# Patient Record
Sex: Female | Born: 1970 | Race: White | Hispanic: No | Marital: Married | Smoking: Former smoker
Health system: Southern US, Community
[De-identification: ages and names within clinical notes are randomized; demographics above are authoritative.]

## PROBLEM LIST (undated history)

## (undated) DIAGNOSIS — Z789 Other specified health status: Secondary | ICD-10-CM

## (undated) DIAGNOSIS — F32A Depression, unspecified: Secondary | ICD-10-CM

## (undated) DIAGNOSIS — T7840XA Allergy, unspecified, initial encounter: Secondary | ICD-10-CM

## (undated) DIAGNOSIS — M5136 Other intervertebral disc degeneration, lumbar region: Secondary | ICD-10-CM

## (undated) DIAGNOSIS — F419 Anxiety disorder, unspecified: Secondary | ICD-10-CM

## (undated) DIAGNOSIS — M51369 Other intervertebral disc degeneration, lumbar region without mention of lumbar back pain or lower extremity pain: Secondary | ICD-10-CM

## (undated) DIAGNOSIS — K219 Gastro-esophageal reflux disease without esophagitis: Secondary | ICD-10-CM

## (undated) DIAGNOSIS — M1990 Unspecified osteoarthritis, unspecified site: Secondary | ICD-10-CM

## (undated) DIAGNOSIS — F329 Major depressive disorder, single episode, unspecified: Secondary | ICD-10-CM

## (undated) HISTORY — DX: Other intervertebral disc degeneration, lumbar region without mention of lumbar back pain or lower extremity pain: M51.369

## (undated) HISTORY — DX: Depression, unspecified: F32.A

## (undated) HISTORY — DX: Unspecified osteoarthritis, unspecified site: M19.90

## (undated) HISTORY — DX: Major depressive disorder, single episode, unspecified: F32.9

## (undated) HISTORY — DX: Other intervertebral disc degeneration, lumbar region: M51.36

## (undated) HISTORY — DX: Anxiety disorder, unspecified: F41.9

## (undated) HISTORY — PX: TUBAL LIGATION: SHX77

## (undated) HISTORY — DX: Other specified health status: Z78.9

## (undated) HISTORY — DX: Gastro-esophageal reflux disease without esophagitis: K21.9

## (undated) HISTORY — DX: Allergy, unspecified, initial encounter: T78.40XA

---

## 2006-03-11 ENCOUNTER — Emergency Department: Payer: Self-pay | Admitting: Internal Medicine

## 2007-07-28 ENCOUNTER — Emergency Department: Payer: Self-pay | Admitting: Emergency Medicine

## 2009-03-26 HISTORY — PX: ENDOMETRIAL ABLATION: SHX621

## 2009-11-23 ENCOUNTER — Encounter: Payer: Self-pay | Admitting: Family Medicine

## 2010-10-06 ENCOUNTER — Ambulatory Visit: Payer: Self-pay | Admitting: Family Medicine

## 2011-07-04 ENCOUNTER — Encounter: Payer: Self-pay | Admitting: Family Medicine

## 2011-07-05 ENCOUNTER — Encounter: Payer: Self-pay | Admitting: Family Medicine

## 2011-11-05 ENCOUNTER — Encounter: Payer: Self-pay | Admitting: Family Medicine

## 2012-02-04 ENCOUNTER — Encounter: Payer: Self-pay | Admitting: Family Medicine

## 2012-07-30 ENCOUNTER — Encounter: Payer: Self-pay | Admitting: Family Medicine

## 2013-03-26 LAB — HM MAMMOGRAPHY: HM MAMMO: NORMAL

## 2013-03-26 LAB — HM PAP SMEAR: HM Pap smear: NORMAL

## 2013-04-30 ENCOUNTER — Emergency Department: Payer: Self-pay | Admitting: Emergency Medicine

## 2013-04-30 LAB — COMPREHENSIVE METABOLIC PANEL
ALK PHOS: 87 U/L
Albumin: 3.7 g/dL (ref 3.4–5.0)
Anion Gap: 2 — ABNORMAL LOW (ref 7–16)
BUN: 11 mg/dL (ref 7–18)
Bilirubin,Total: 0.2 mg/dL (ref 0.2–1.0)
Calcium, Total: 8.8 mg/dL (ref 8.5–10.1)
Chloride: 107 mmol/L (ref 98–107)
Co2: 31 mmol/L (ref 21–32)
Creatinine: 0.82 mg/dL (ref 0.60–1.30)
GLUCOSE: 90 mg/dL (ref 65–99)
Osmolality: 278 (ref 275–301)
Potassium: 3.7 mmol/L (ref 3.5–5.1)
SGOT(AST): 12 U/L — ABNORMAL LOW (ref 15–37)
SGPT (ALT): 19 U/L (ref 12–78)
SODIUM: 140 mmol/L (ref 136–145)
TOTAL PROTEIN: 7.2 g/dL (ref 6.4–8.2)

## 2013-04-30 LAB — CBC
HCT: 38.1 % (ref 35.0–47.0)
HGB: 12.8 g/dL (ref 12.0–16.0)
MCH: 31.4 pg (ref 26.0–34.0)
MCHC: 33.6 g/dL (ref 32.0–36.0)
MCV: 93 fL (ref 80–100)
Platelet: 236 10*3/uL (ref 150–440)
RBC: 4.08 10*6/uL (ref 3.80–5.20)
RDW: 12.9 % (ref 11.5–14.5)
WBC: 7.2 10*3/uL (ref 3.6–11.0)

## 2013-04-30 LAB — TROPONIN I: Troponin-I: <0.02 ng/mL

## 2013-06-26 DIAGNOSIS — M5136 Other intervertebral disc degeneration, lumbar region: Secondary | ICD-10-CM | POA: Insufficient documentation

## 2013-06-26 DIAGNOSIS — K589 Irritable bowel syndrome without diarrhea: Secondary | ICD-10-CM | POA: Insufficient documentation

## 2013-07-08 ENCOUNTER — Encounter: Payer: Self-pay | Admitting: Rheumatology

## 2013-07-24 ENCOUNTER — Encounter: Payer: Self-pay | Admitting: Rheumatology

## 2013-11-23 ENCOUNTER — Encounter: Payer: Self-pay | Admitting: Family Medicine

## 2013-11-23 LAB — HM PAP SMEAR: HM PAP: NORMAL

## 2013-11-25 ENCOUNTER — Encounter: Payer: Self-pay | Admitting: Family Medicine

## 2013-11-26 LAB — LIPID PANEL
Cholesterol: 170 (ref 0–200)
HDL: 59 (ref 35–70)
LDL CALC: 91
TRIGLYCERIDES: 98 (ref 40–160)

## 2013-11-26 LAB — VITAMIN D 25 HYDROXY (VIT D DEFICIENCY, FRACTURES): Vit D, 25-Hydroxy: 48.2

## 2013-11-26 LAB — CBC AND DIFFERENTIAL
HCT: 40 (ref 36–46)
HEMOGLOBIN: 14 (ref 12.0–16.0)
NEUTROS ABS: 4
PLATELETS: 237 (ref 150–399)
WBC: 6.4

## 2013-11-26 LAB — BASIC METABOLIC PANEL
BUN: 13 (ref 4–21)
Creatinine: 0.8 (ref 0.5–1.1)
Glucose: 97
POTASSIUM: 4.2 (ref 3.4–5.3)
SODIUM: 139 (ref 137–147)

## 2013-11-26 LAB — HEPATIC FUNCTION PANEL
ALT: 8 (ref 7–35)
AST: 15 (ref 13–35)

## 2013-11-26 LAB — TSH: TSH: 1.33 (ref 0.41–5.90)

## 2014-10-05 ENCOUNTER — Ambulatory Visit: Payer: Self-pay | Admitting: Family Medicine

## 2015-02-14 ENCOUNTER — Ambulatory Visit (INDEPENDENT_AMBULATORY_CARE_PROVIDER_SITE_OTHER): Payer: 59 | Admitting: Family Medicine

## 2015-02-14 ENCOUNTER — Encounter: Payer: Self-pay | Admitting: Family Medicine

## 2015-02-14 VITALS — BP 105/69 | HR 86 | Resp 16 | Ht 65.0 in | Wt 161.2 lb

## 2015-02-14 DIAGNOSIS — Z8249 Family history of ischemic heart disease and other diseases of the circulatory system: Secondary | ICD-10-CM | POA: Diagnosis not present

## 2015-02-14 DIAGNOSIS — F418 Other specified anxiety disorders: Secondary | ICD-10-CM

## 2015-02-14 DIAGNOSIS — L309 Dermatitis, unspecified: Secondary | ICD-10-CM | POA: Insufficient documentation

## 2015-02-14 DIAGNOSIS — F32A Depression, unspecified: Secondary | ICD-10-CM | POA: Insufficient documentation

## 2015-02-14 DIAGNOSIS — R5382 Chronic fatigue, unspecified: Secondary | ICD-10-CM | POA: Diagnosis not present

## 2015-02-14 DIAGNOSIS — K219 Gastro-esophageal reflux disease without esophagitis: Secondary | ICD-10-CM | POA: Insufficient documentation

## 2015-02-14 DIAGNOSIS — M1990 Unspecified osteoarthritis, unspecified site: Secondary | ICD-10-CM

## 2015-02-14 DIAGNOSIS — M545 Low back pain, unspecified: Secondary | ICD-10-CM | POA: Insufficient documentation

## 2015-02-14 DIAGNOSIS — F329 Major depressive disorder, single episode, unspecified: Secondary | ICD-10-CM | POA: Insufficient documentation

## 2015-02-14 DIAGNOSIS — K589 Irritable bowel syndrome without diarrhea: Secondary | ICD-10-CM | POA: Insufficient documentation

## 2015-02-14 DIAGNOSIS — F419 Anxiety disorder, unspecified: Secondary | ICD-10-CM | POA: Insufficient documentation

## 2015-02-14 DIAGNOSIS — F17200 Nicotine dependence, unspecified, uncomplicated: Secondary | ICD-10-CM

## 2015-02-14 DIAGNOSIS — G8929 Other chronic pain: Secondary | ICD-10-CM | POA: Insufficient documentation

## 2015-02-14 DIAGNOSIS — Z720 Tobacco use: Secondary | ICD-10-CM | POA: Diagnosis not present

## 2015-02-14 MED ORDER — TRIAMCINOLONE ACETONIDE 0.1 % EX CREA: 1.0000 "application " | TOPICAL_CREAM | Freq: Two times a day (BID) | CUTANEOUS | Status: DC

## 2015-02-14 MED ORDER — LORAZEPAM 0.5 MG PO TABS: 0.5000 mg | ORAL_TABLET | ORAL | Status: DC | PRN

## 2015-02-14 NOTE — Assessment & Plan Note (Addendum)
Symptoms diet controlled. Brother diet of esophageal cancer. Recommend consult with GI specialist to determine if she is at increased risk. Pt would like to think about it at this time.  Check CMP.

## 2015-02-14 NOTE — Assessment & Plan Note (Signed)
5 minutes smoking cessation counseling completed. Pt encouraged to use resources such as [place] [facility]uitline. Handouts given. Pt is not ready to quit at this time.

## 2015-02-14 NOTE — Progress Notes (Signed)
Subjective:    Patient ID: Kayla Alexander, female    DOB: [birth year], 44 y.o.   MRN: [id number]  HPI: Kayla Alexander is a 44 y.o. female presenting on 02/14/2015 for Establish Care   HPI Pt presents to establish care today. Previous care provider was [facility].  It has been 1.5 years since her last PCP visit. Records from previous provider will be requested and reviewed. Current medical problems include:  Acid Reflux: Symptoms controlled at  Home with diet. Avoids food triggers. On medication in the past- did well. Occasional symptoms. No n/v, no regurg or dyphagia.  Depression/ Anxiety: Has mood ups and downs. Symptoms got worse when she stopped having her period. Was on medication in the past but unable to take due to side effects. Took lorazepam PRN for anxiety attacks. Symptoms occur occasionally. Uses medication 10-15 per month.  Eczema: Pt gets dry skin and eczematous rash in the winter. Bilateral hands and legs.  Uses kenalog cream as needed.    Pt also concerned about fatigue. She is tried all the time despite adequate rest and exercise.  Health maintenance:  Last pap smear: 2015- normal, n hx abnormal- unsure if HPV cotesting done. Mammogram: Normal  TDAP- unsure. Declines. Flu shot- declines.  Current smoker- quit for 3 years in the past. Currently smoking 1/2 pack per day.  Occasional ETOH.  Exercise: Walks/run on treadmill- 3-4 x per week.     Past Medical History  Diagnosis Date  . GERD (gastroesophageal reflux disease)   . Depression   . Allergy   . Arthritis   . Gravida 2 para 2    Social History   Social History  . Marital Status: Divorced    Spouse Name: N/A  . Number of Children: N/A  . Years of Education: N/A   Occupational History  . Not on file.   Social History Main Topics  . Smoking status: Current Every Day Smoker -- 0.50 packs/day for 20 years    Types: Cigarettes  . Smokeless tobacco: Never Used  . Alcohol Use: Not on file  . Drug  Use: Not on file  . Sexual Activity: Not on file   Other Topics Concern  . Not on file   Social History Narrative  . No narrative on file   Family History  Problem Relation Age of Onset  . Stroke Mother   . Cancer Sister   . Depression Sister   . Diabetes Maternal Grandmother   . Diabetes Maternal Grandfather   . Heart disease Paternal Grandmother    No current outpatient prescriptions on file prior to visit.   No current facility-administered medications on file prior to visit.    Review of Systems  Constitutional: Positive for fatigue. Negative for fever and chills.  HENT: Negative.   Respiratory: Negative for cough, chest tightness and wheezing.   Cardiovascular: Negative for chest pain and leg swelling.  Gastrointestinal: Negative for nausea, vomiting, abdominal pain, diarrhea and constipation.  Endocrine: Negative.  Negative for cold intolerance, heat intolerance, polydipsia, polyphagia and polyuria.  Genitourinary: Negative for dysuria and difficulty urinating.  Musculoskeletal: Negative.   Neurological: Negative for dizziness, light-headedness and numbness.  Psychiatric/Behavioral: Negative.    Per HPI unless specifically indicated above     Objective:    BP 105/69 mmHg  Pulse 86  Resp 16  Ht 5\' 5"  (1.651 m)  Wt 161 lb 3.2 oz (73.12 kg)  BMI 26.83 kg/m2  Wt Readings from Last 3 Encounters:  02/14/15  161 lb 3.2 oz (73.12 kg)    Physical Exam  Constitutional: She is oriented to person, place, and time. She appears well-developed and well-nourished.  HENT:  Head: Normocephalic and atraumatic.  Neck: Neck supple.  Cardiovascular: Normal rate, regular rhythm and normal heart sounds.  Exam reveals no gallop and no friction rub.   No murmur heard. Pulmonary/Chest: Effort normal and breath sounds normal. She has no wheezes. She exhibits no tenderness.  Abdominal: Soft. Normal appearance and bowel sounds are normal. She exhibits no distension and no mass. There  is no tenderness. There is no rebound and no guarding.  Musculoskeletal: Normal range of motion. She exhibits no edema or tenderness.  Lymphadenopathy:    She has no cervical adenopathy.  Neurological: She is alert and oriented to person, place, and time.  Skin: Skin is warm and dry. Rash noted. She is not diaphoretic. No pallor.  Pink scaling rash on bilateral hands and elbows consistent with eczema.    Results for orders placed or performed in visit on 02/14/15  HM MAMMOGRAPHY  Result Value Ref Range   HM Mammogram normal   HM PAP SMEAR  Result Value Ref Range   HM Pap smear normal       Assessment & Plan:   Problem List Items Addressed This Visit      Digestive   GERD (gastroesophageal reflux disease) - Primary    Symptoms diet controlled. Brother diet of esophageal cancer. Recommend consult with GI specialist to determine if she is at increased risk. Pt would like to think about it at this time.  Check CMP.       Relevant Orders   Basic Metabolic Panel (BMET)   IBS (irritable bowel syndrome)     Musculoskeletal and Integument   Arthritis   Eczema    Kenalog cream renewed.       Relevant Medications   triamcinolone cream (KENALOG) 0.1 %     Other   Anxiety and depression    Check TSH, vitamin D. Renew PRN lorazepam for panic attacks. Encouraged daily exercise to help calm symptoms.        Relevant Medications   LORazepam (ATIVAN) 0.5 MG tablet   Other Relevant Orders   TSH   VITAMIN D 25 Hydroxy (Vit-D Deficiency, Fractures)   Smoker    5 minutes smoking cessation counseling completed. Pt encouraged to use resources such as [place] [facility]uitline. Handouts given. Pt is not ready to quit at this time.        Other Visit Diagnoses    Chronic fatigue        Check labs to r/o organic cause: TSH, B12, Vitamin D.  Recommend OTC 2000IU vitamin D daily to help increase energy.    Relevant Orders    Vitamin B12    Family history of heart disease        Baseline lipids.          Meds ordered this encounter  Medications  . DISCONTD: LORazepam (ATIVAN) 0.5 MG tablet    Sig: Take 0.5 mg by mouth as needed for anxiety.  . triamcinolone cream (KENALOG) 0.1 %    Sig: Apply 1 application topically 2 (two) times daily.    Dispense:  30 g    Refill:  3    Order Specific Question:  Supervising Provider    Answer:  Arlis Porta [id number]  . LORazepam (ATIVAN) 0.5 MG tablet    Sig: Take 1 tablet (0.5 mg total) by  mouth as needed for anxiety.    Dispense:  30 tablet    Refill:  1    Order Specific Question:  Supervising Provider    Answer:  Arlis Porta [id number]      Follow up plan: Return in about 1 year (around 02/14/2016) for well woman.

## 2015-02-14 NOTE — Assessment & Plan Note (Signed)
Check TSH, vitamin D. Renew PRN lorazepam for panic attacks. Encouraged daily exercise to help calm symptoms.

## 2015-02-14 NOTE — Assessment & Plan Note (Signed)
Kenalog cream renewed.

## 2015-08-10 DIAGNOSIS — C4491 Basal cell carcinoma of skin, unspecified: Secondary | ICD-10-CM

## 2015-08-10 HISTORY — DX: Basal cell carcinoma of skin, unspecified: C44.91

## 2015-10-17 ENCOUNTER — Encounter: Payer: Self-pay | Admitting: Family Medicine

## 2015-10-17 ENCOUNTER — Ambulatory Visit (INDEPENDENT_AMBULATORY_CARE_PROVIDER_SITE_OTHER): Payer: 59 | Admitting: Family Medicine

## 2015-10-17 DIAGNOSIS — F419 Anxiety disorder, unspecified: Secondary | ICD-10-CM

## 2015-10-17 DIAGNOSIS — G4700 Insomnia, unspecified: Secondary | ICD-10-CM | POA: Diagnosis not present

## 2015-10-17 DIAGNOSIS — F329 Major depressive disorder, single episode, unspecified: Secondary | ICD-10-CM

## 2015-10-17 DIAGNOSIS — F418 Other specified anxiety disorders: Secondary | ICD-10-CM | POA: Diagnosis not present

## 2015-10-17 DIAGNOSIS — F32A Depression, unspecified: Secondary | ICD-10-CM

## 2015-10-17 LAB — BASIC METABOLIC PANEL WITH GFR
BUN: 12 mg/dL (ref 7–25)
CALCIUM: 8.6 mg/dL (ref 8.6–10.2)
CHLORIDE: 105 mmol/L (ref 98–110)
CO2: 25 mmol/L (ref 20–31)
CREATININE: 0.92 mg/dL (ref 0.50–1.10)
GFR, Est African American: 87 mL/min (ref >=60)
GFR, Est Non African American: 75 mL/min (ref >=60)
GLUCOSE: 91 mg/dL (ref 65–99)
Potassium: 4.4 mmol/L (ref 3.5–5.3)
SODIUM: 138 mmol/L (ref 135–146)

## 2015-10-17 LAB — TSH: TSH: 0.64 mIU/L

## 2015-10-17 MED ORDER — LORAZEPAM 0.5 MG PO TABS: 0.5000 mg | ORAL_TABLET | ORAL | 2 refills | Status: DC | PRN

## 2015-10-17 MED ORDER — TRAZODONE HCL 50 MG PO TABS: 25.0000 mg | ORAL_TABLET | Freq: Every evening | ORAL | 3 refills | Status: DC | PRN

## 2015-10-17 NOTE — Patient Instructions (Addendum)
Learning About Sleeping Well What does sleeping well mean? Sleeping well means getting enough sleep. How much sleep is enough varies among people. The number of hours you sleep is not as important as how you feel when you wake up. If you do not feel refreshed, you probably need more sleep. Another sign of not getting enough sleep is feeling tired during the day. The average total nightly sleep time is 7 to 8 hours. Healthy adults may need a little more or a little less than this. Why is getting enough sleep important? Getting enough quality sleep is a basic part of good health. When your sleep suffers, your mood and your thoughts can suffer too. You may find yourself feeling more grumpy or stressed. Not getting enough sleep also can lead to serious problems, including injury, accidents, anxiety, and depression. What might cause poor sleeping? Many things can cause sleep problems, including: 1. Stress. Stress can be caused by fear about a single event, such as giving a speech. Or you may have ongoing stress, such as worry about work or school. 2. Depression, anxiety, and other mental or emotional conditions. 3. Changes in your sleep habits or surroundings. This includes changes that happen where you sleep, such as noise, light, or sleeping in a different bed. It also includes changes in your sleep pattern, such as having jet lag or working a late shift. 4. Health problems, such as pain, breathing problems, and restless legs syndrome. 5. Lack of regular exercise. How can you help yourself? Here are some tips that may help you sleep more soundly and wake up feeling more refreshed. Your sleeping area  1. Use your bedroom only for sleeping and sex. A bit of light reading may help you fall asleep. But if it doesn't, do your reading elsewhere in the house. Don't watch TV in bed. 2. Be sure your bed is big enough to stretch out comfortably, especially if you have a sleep partner. 3. Keep your bedroom  quiet, dark, and cool. Use curtains, blinds, or a sleep mask to block out light. To block out noise, use earplugs, soothing music, or a "white noise" machine. Your evening and bedtime routine  1. Create a relaxing bedtime routine. You might want to take a warm shower or bath, listen to soothing music, or drink a cup of noncaffeinated tea. 2. Go to bed at the same time every night. And get up at the same time every morning, even if you feel tired. What to avoid  1. Limit caffeine (coffee, tea, caffeinated sodas) during the day, and don't have any for at least 4 to 6 hours before bedtime. 2. Don't drink alcohol before bedtime. Alcohol can cause you to wake up more often during the night. 3. Don't smoke or use tobacco, especially in the evening. Nicotine can keep you awake. 4. Don't take naps during the day, especially close to bedtime. 5. Don't lie in bed awake for too long. If you can't fall asleep, or if you wake up in the middle of the night and can't get back to sleep within 15 minutes or so, get out of bed and go to another room until you feel sleepy. 6. Don't take medicine right before bed that may keep you awake or make you feel hyper or energized. Your doctor can tell you if your medicine may do this and if you can take it earlier in the day. If you can't sleep   Imagine yourself in a peaceful, pleasant scene. Focus on  the details and feelings of being in a place that is relaxing.  Get up and do a quiet or boring activity until you feel sleepy.  Don't drink any liquids after 6 p.m. if you wake up often because you have to go to the bathroom.    Trazodone tablets What is this medicine? TRAZODONE (TRAZ oh done) is used to treat depression. This medicine may be used for other purposes; ask your health care provider or pharmacist if you have questions. What should I tell my health care provider before I take this medicine? They need to know if you have any of these conditions: -attempted  suicide or thinking about it -bipolar disorder -bleeding problems -glaucoma -heart disease, or previous heart attack -irregular heart beat -kidney or liver disease -low levels of sodium in the blood -an unusual or allergic reaction to trazodone, other medicines, foods, dyes or preservatives -pregnant or trying to get pregnant -breast-feeding How should I use this medicine? Take this medicine by mouth with a glass of water. Follow the directions on the prescription label. Take this medicine shortly after a meal or a light snack. Take your medicine at regular intervals. Do not take your medicine more often than directed. Do not stop taking this medicine suddenly except upon the advice of your doctor. Stopping this medicine too quickly may cause serious side effects or your condition may worsen. A special MedGuide will be given to you by the pharmacist with each prescription and refill. Be sure to read this information carefully each time. Talk to your pediatrician regarding the use of this medicine in children. Special care may be needed. Overdosage: If you think you have taken too much of this medicine contact a poison control center or emergency room at once. NOTE: This medicine is only for you. Do not share this medicine with others. What if I miss a dose? If you miss a dose, take it as soon as you can. If it is almost time for your next dose, take only that dose. Do not take double or extra doses. What may interact with this medicine? Do not take this medicine with any of the following medications: -certain medicines for fungal infections like fluconazole, itraconazole, ketoconazole, posaconazole, voriconazole -cisapride -dofetilide -dronedarone -linezolid -MAOIs like Carbex, Eldepryl, Marplan, Nardil, and Parnate -mesoridazine -methylene blue (injected into a vein) -pimozide -saquinavir -thioridazine -ziprasidone This medicine may also interact with the following  medications: -alcohol -antiviral medicines for HIV or AIDS -aspirin and aspirin-like medicines -barbiturates like phenobarbital -certain medicines for blood pressure, heart disease, irregular heart beat -certain medicines for depression, anxiety, or psychotic disturbances -certain medicines for migraine headache like almotriptan, eletriptan, frovatriptan, naratriptan, rizatriptan, sumatriptan, zolmitriptan -certain medicines for seizures like carbamazepine and phenytoin -certain medicines for sleep -certain medicines that treat or prevent blood clots like dalteparin, enoxaparin, warfarin -digoxin -fentanyl -lithium -NSAIDS, medicines for pain and inflammation, like ibuprofen or naproxen -other medicines that prolong the QT interval (cause an abnormal heart rhythm) -rasagiline -supplements like St. John's wort, kava kava, valerian -tramadol -tryptophan This list may not describe all possible interactions. Give your health care provider a list of all the medicines, herbs, non-prescription drugs, or dietary supplements you use. Also tell them if you smoke, drink alcohol, or use illegal drugs. Some items may interact with your medicine. What should I watch for while using this medicine? Tell your doctor if your symptoms do not get better or if they get worse. Visit your doctor or health care professional for regular checks  on your progress. Because it may take several weeks to see the full effects of this medicine, it is important to continue your treatment as prescribed by your doctor. Patients and their families should watch out for new or worsening thoughts of suicide or depression. Also watch out for sudden changes in feelings such as feeling anxious, agitated, panicky, irritable, hostile, aggressive, impulsive, severely restless, overly excited and hyperactive, or not being able to sleep. If this happens, especially at the beginning of treatment or after a change in dose, call your health  care professional. Dennis Bast may get drowsy or dizzy. Do not drive, use machinery, or do anything that needs mental alertness until you know how this medicine affects you. Do not stand or sit up quickly, especially if you are an older patient. This reduces the risk of dizzy or fainting spells. Alcohol may interfere with the effect of this medicine. Avoid alcoholic drinks. This medicine may cause dry eyes and blurred vision. If you wear contact lenses you may feel some discomfort. Lubricating drops may help. See your eye doctor if the problem does not go away or is severe. Your mouth may get dry. Chewing sugarless gum, sucking hard candy and drinking plenty of water may help. Contact your doctor if the problem does not go away or is severe. What side effects may I notice from receiving this medicine? Side effects that you should report to your doctor or health care professional as soon as possible: -allergic reactions like skin rash, itching or hives, swelling of the face, lips, or tongue -fast, irregular heartbeat -feeling faint or lightheaded, falls -painful erections or other sexual dysfunction -suicidal thoughts or other mood changes -trembling Side effects that usually do not require medical attention (report to your doctor or health care professional if they continue or are bothersome): -constipation -headache -muscle aches or pains -nausea, vomiting -unusually weak or tired This list may not describe all possible side effects. Call your doctor for medical advice about side effects. You may report side effects to FDA at 1-800-FDA-1088. Where should I keep my medicine? Keep out of the reach of children. Store at room temperature between 15 and 30 degrees C (59 to 86 degrees F). Protect from light. Keep container tightly closed. Throw away any unused medicine after the expiration date. NOTE: This sheet is a summary. It may not cover all possible information. If you have questions about this  medicine, talk to your doctor, pharmacist, or health care provider.    2016, [employer]vier/Gold Standard. (2012-10-13 15:46:28)

## 2015-10-17 NOTE — Assessment & Plan Note (Signed)
Reviewed sleep hygiene tips to help with sleep. Trial of PRN trazodone. Pt cautioned not to mix lorazepam.

## 2015-10-17 NOTE — Assessment & Plan Note (Signed)
Renewed PRN ativan for panic symptoms. Consider SSRI if symptoms continue despite trazodone.

## 2015-10-17 NOTE — Progress Notes (Signed)
Subjective:    Patient ID: Kayla Alexander, female    DOB: [birth year], 45 y.o.   MRN: [id number]  HPI: Kayla Alexander is a 45 y.o. female presenting on 10/17/2015 for Medication Refill (anxiety still same )   HPI  Pt presents to discuss lorazepam for anxiety. Has noticed she is more irritable after she takes medications. Takes it at night PRN. Has noticed that she is irritable. Only takes lorazepam at bedtime Always feeling tired. Doesn't sleep well. Tried OTC sleep aids to help her with mild relief. Reports she wakes up at 2am from dreams or hot flashes. Not able to go back to sleep.   Past Medical History:  Diagnosis Date  . Allergy   . Arthritis   . Depression   . GERD (gastroesophageal reflux disease)   . Gravida 2 para 2     Current Outpatient Prescriptions on File Prior to Visit  Medication Sig  . triamcinolone cream (KENALOG) 0.1 % Apply 1 application topically 2 (two) times daily.   No current facility-administered medications on file prior to visit.     Review of Systems  Constitutional: Positive for fatigue. Negative for chills and fever.  HENT: Negative.   Respiratory: Negative for cough, chest tightness and wheezing.   Cardiovascular: Negative for chest pain and leg swelling.  Gastrointestinal: Negative for abdominal pain, constipation, diarrhea, nausea and vomiting.  Endocrine: Negative.  Negative for cold intolerance, heat intolerance, polydipsia, polyphagia and polyuria.  Genitourinary: Negative for difficulty urinating and dysuria.  Musculoskeletal: Negative.   Neurological: Negative for dizziness, light-headedness and numbness.  Psychiatric/Behavioral: Positive for sleep disturbance. Negative for agitation, behavioral problems, dysphoric mood, hallucinations and suicidal ideas. The patient is nervous/anxious.    Per HPI unless specifically indicated above GAD 7 : Generalized Anxiety Score 10/17/2015  Nervous, Anxious, on Edge 1  Control/stop worrying 1  Worry  too much - different things 2  Trouble relaxing 3  Restless 1  Easily annoyed or irritable 2  Afraid - awful might happen 1  Total GAD 7 Score 11  Anxiety Difficulty Not difficult at all        Objective:    BP 107/68 (BP Location: Right Arm, Patient Position: Sitting, Cuff Size: Normal)   Pulse 68   Temp 98.4 F (36.9 C) (Oral)   Resp 16   Ht 5\' 6"  (1.676 m)   Wt 168 lb (76.2 kg)   BMI 27.12 kg/m   Wt Readings from Last 3 Encounters:  10/17/15 168 lb (76.2 kg)  02/14/15 161 lb 3.2 oz (73.1 kg)    Physical Exam  Constitutional: She is oriented to person, place, and time. She appears well-developed and well-nourished.  HENT:  Head: Normocephalic and atraumatic.  Neck: Neck supple.  Cardiovascular: Normal rate, regular rhythm and normal heart sounds.  Exam reveals no gallop and no friction rub.   No murmur heard. Pulmonary/Chest: Effort normal and breath sounds normal. She has no wheezes. She exhibits no tenderness.  Abdominal: Soft. Normal appearance and bowel sounds are normal. She exhibits no distension and no mass. There is no tenderness. There is no rebound and no guarding.  Musculoskeletal: Normal range of motion. She exhibits no edema or tenderness.  Lymphadenopathy:    She has no cervical adenopathy.  Neurological: She is alert and oriented to person, place, and time.  Skin: Skin is warm and dry.  Psychiatric: Her speech is normal and behavior is normal. Judgment and thought content normal. Her mood appears anxious. Cognition and memory  are normal.   Results for orders placed or performed in visit on 02/14/15  HM MAMMOGRAPHY  Result Value Ref Range   HM Mammogram normal   HM PAP SMEAR  Result Value Ref Range   HM Pap smear normal       Assessment & Plan:   Problem List Items Addressed This Visit      Other   Anxiety and depression    Renewed PRN ativan for panic symptoms. Consider SSRI if symptoms continue despite trazodone.       Relevant Medications    LORazepam (ATIVAN) 0.5 MG tablet   Other Relevant Orders   VITAMIN D 25 Hydroxy (Vit-D Deficiency, Fractures)   123456   BASIC METABOLIC PANEL WITH GFR   TSH   Insomnia    Reviewed sleep hygiene tips to help with sleep. Trial of PRN trazodone. Pt cautioned not to mix lorazepam.        Other Visit Diagnoses   None.     Meds ordered this encounter  Medications  . traZODone (DESYREL) 50 MG tablet    Sig: Take 0.5-1 tablets (25-50 mg total) by mouth at bedtime as needed for sleep.    Dispense:  30 tablet    Refill:  3    Order Specific Question:   Supervising Provider    Answer:   Arlis Porta [phone]  . LORazepam (ATIVAN) 0.5 MG tablet    Sig: Take 1 tablet (0.5 mg total) by mouth as needed for anxiety.    Dispense:  30 tablet    Refill:  2    Order Specific Question:   Supervising Provider    Answer:   Arlis Porta [id number]      Follow up plan: Return in about 4 weeks (around 11/14/2015) for sleep issues.

## 2015-10-18 LAB — VITAMIN D 25 HYDROXY (VIT D DEFICIENCY, FRACTURES): Vit D, 25-Hydroxy: 44 ng/mL (ref 30–100)

## 2015-10-18 LAB — VITAMIN B12: Vitamin B-12: 237 pg/mL (ref 200–1100)

## 2015-11-15 ENCOUNTER — Emergency Department
Admission: EM | Admit: 2015-11-15 | Discharge: 2015-11-15 | Disposition: A | Discharge status: Home / Self Care | Payer: 59 | Attending: Emergency Medicine | Admitting: Emergency Medicine

## 2015-11-15 ENCOUNTER — Ambulatory Visit (INDEPENDENT_AMBULATORY_CARE_PROVIDER_SITE_OTHER): Payer: 59 | Admitting: Family Medicine

## 2015-11-15 ENCOUNTER — Emergency Department: Payer: 59

## 2015-11-15 ENCOUNTER — Encounter: Payer: Self-pay | Admitting: *Deleted

## 2015-11-15 VITALS — BP 108/71 | HR 61 | Temp 98.4°F | Resp 16 | Ht 65.0 in | Wt 169.0 lb

## 2015-11-15 DIAGNOSIS — R110 Nausea: Secondary | ICD-10-CM | POA: Insufficient documentation

## 2015-11-15 DIAGNOSIS — R51 Headache: Secondary | ICD-10-CM | POA: Diagnosis not present

## 2015-11-15 DIAGNOSIS — R519 Headache, unspecified: Secondary | ICD-10-CM

## 2015-11-15 DIAGNOSIS — G4482 Headache associated with sexual activity: Secondary | ICD-10-CM

## 2015-11-15 DIAGNOSIS — F17210 Nicotine dependence, cigarettes, uncomplicated: Secondary | ICD-10-CM | POA: Diagnosis not present

## 2015-11-15 MED ORDER — INDOMETHACIN 25 MG PO CAPS: 25.0000 mg | ORAL_CAPSULE | Freq: Every day | ORAL | 1 refills | Status: DC

## 2015-11-15 MED ORDER — SUMATRIPTAN 20 MG/ACT NA SOLN: 1.0000 | NASAL | 2 refills | Status: DC | PRN

## 2015-11-15 NOTE — Progress Notes (Signed)
Subjective:    Patient ID: Kayla Alexander, female    DOB: January 18, 1971, 45 y.o.   MRN: [id number]  HPI: Kayla Alexander is a 45 y.o. female presenting on 11/15/2015 for Headache (onset 2 days sudden middle part of frontal head hurts and feels nausea as per pt it's excurciating pain)   HPI  Pt presents for new onset headache that started suddenly on Sunday. Pt reports she does not have any history of migraines or other headaches. She is very concerned about the headache and feels something is wrong. Symptoms began during sex on Sunday. Headache on the pareiatal and L temporal portion of the head. Pain is so much that is makes her sick. Feels like skull is going to crack open- comes on very fast. Headache has occurred 3 times each time during exertional activity. Hurts for about 10-20 minutes and then goes away. This morning it is lingering. When pain is present she feels nauseated and has vision changes. No fevers. No chills. No numbness or tingling in extremities. No history of head trauma or TBI.   Past Medical History:  Diagnosis Date  . Allergy   . Arthritis   . Depression   . GERD (gastroesophageal reflux disease)   . Gravida 2 para 2     Current Outpatient Prescriptions on File Prior to Visit  Medication Sig  . LORazepam (ATIVAN) 0.5 MG tablet Take 1 tablet (0.5 mg total) by mouth as needed for anxiety.  . traZODone (DESYREL) 50 MG tablet Take 0.5-1 tablets (25-50 mg total) by mouth at bedtime as needed for sleep.  Marland Kitchen triamcinolone cream (KENALOG) 0.1 % Apply 1 application topically 2 (two) times daily.   No current facility-administered medications on file prior to visit.     Review of Systems  Constitutional: Negative for fever.  HENT: Negative for congestion, dental problem, sinus pressure and trouble swallowing.   Eyes: Positive for visual disturbance.  Respiratory: Negative for chest tightness, shortness of breath and wheezing.   Cardiovascular: Negative for chest pain,  palpitations and leg swelling.  Gastrointestinal: Positive for nausea.  Musculoskeletal: Negative for neck pain and neck stiffness.  Neurological: Positive for headaches. Negative for dizziness, tremors, facial asymmetry, weakness and numbness.   Per HPI unless specifically indicated above     Objective:    BP 108/71 (BP Location: Right Arm, Patient Position: Sitting, Cuff Size: Normal)   Pulse 61   Temp 98.4 F (36.9 C) (Oral)   Resp 16   Ht 5\' 5"  (1.651 m)   Wt 169 lb (76.7 kg)   BMI 28.12 kg/m   Wt Readings from Last 3 Encounters:  11/15/15 169 lb (76.7 kg)  10/17/15 168 lb (76.2 kg)  02/14/15 161 lb 3.2 oz (73.1 kg)    Physical Exam  Constitutional: She is oriented to person, place, and time. She appears well-developed and well-nourished.  Eyes: Conjunctivae and EOM are normal. Pupils are equal, round, and reactive to light. Lids are everted and swept, no foreign bodies found. Right eye exhibits no nystagmus. Left eye exhibits no nystagmus.  Cardiovascular: Normal rate, regular rhythm and normal pulses.  Exam reveals no gallop and no friction rub.   No murmur heard. Neurological: She is oriented to person, place, and time. She has normal strength. No cranial nerve deficit or sensory deficit. She displays a negative Romberg sign. Gait normal. GCS eye subscore is 4. GCS verbal subscore is 5. GCS motor subscore is 6.  Reflex Scores:      Patellar  reflexes are 2+ on the right side and 2+ on the left side. Heel to shin intact. Finger to nose intact.  Heel/toe walking intact.   Psychiatric: Her speech is normal and behavior is normal. Judgment and thought content normal. Her mood appears anxious. Cognition and memory are normal.   Results for orders placed or performed in visit on 10/17/15  VITAMIN D 25 Hydroxy (Vit-D Deficiency, Fractures)  Result Value Ref Range   Vit D, 25-Hydroxy 44 30 - 100 ng/mL  B12  Result Value Ref Range   Vitamin B-12 237 200 - 1,100 pg/mL  BASIC  METABOLIC PANEL WITH GFR  Result Value Ref Range   Sodium 138 135 - 146 mmol/L   Potassium 4.4 3.5 - 5.3 mmol/L   Chloride 105 98 - 110 mmol/L   CO2 25 20 - 31 mmol/L   Glucose, Bld 91 65 - 99 mg/dL   BUN 12 7 - 25 mg/dL   Creat 0.92 0.50 - 1.10 mg/dL   Calcium 8.6 8.6 - 10.2 mg/dL   GFR, Est African American 87 >=60 mL/min   GFR, Est Non African American 75 >=60 mL/min  TSH  Result Value Ref Range   TSH 0.64 mIU/L      Assessment & Plan:   Problem List Items Addressed This Visit    None    Visit Diagnoses    Sudden onset of severe headache    -  Primary   Due to the severity and sudden onset, onset in patient age >62 with no previous history of headaches, and HA that occurs with exertional activity- have referred pt to the ER for evaluation of headache. She is neurologically intact, which is reassuring.  Will plan to follow-up with patient regarding her symptoms following the ER.    Nausea          No orders of the defined types were placed in this encounter.     Follow up plan: Return if symptoms worsen or fail to improve.

## 2015-11-15 NOTE — ED Provider Notes (Signed)
[facility] Emergency Department Provider Note  ____________________________________________  Time seen: Approximately 11:40 AM  I have reviewed the triage vital signs and the nursing notes.   HISTORY  Chief Complaint Migraine    HPI Kayla Alexander is a 45 y.o. female presents for evaluation of 2 week history of severe migraine headaches associated with sexual activity. Patient states that the headache will occur during a immediately after sex and last for about 10 to 20 minutes followed by some nausea.Denies any visual changes. Denies any audio pain. She describes her pain as a worst pain ever. Patient reports past medical history for cervical lesion no menses in the last 10 years.   Past Medical History:  Diagnosis Date  . Allergy   . Arthritis   . Depression   . GERD (gastroesophageal reflux disease)   . Gravida 2 para 2     Patient Active Problem List   Diagnosis Date Noted  . Insomnia 10/17/2015  . GERD (gastroesophageal reflux disease) 02/14/2015  . Arthritis 02/14/2015  . IBS (irritable bowel syndrome) 02/14/2015  . Anxiety and depression 02/14/2015  . Eczema 02/14/2015  . Smoker 02/14/2015  . 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 Disc disorder 06/26/2013    Past Surgical History:  Procedure Laterality Date  . Ablasion     . ENDOMETRIAL ABLATION Bilateral 2011    Prior to Admission medications   Medication Sig Start Date End Date Taking? Authorizing Provider  indomethacin (INDOCIN) 25 MG capsule Take 1 capsule (25 mg total) by mouth daily. 11/15/15   Pierce Crane Nyanna Heideman, [name]  LORazepam (ATIVAN) 0.5 MG tablet Take 1 tablet (0.5 mg total) by mouth as needed for anxiety. 10/17/15   Amy Overton Mam, NP  SUMAtriptan (IMITREX) 20 MG/ACT nasal spray Place 1 spray (20 mg total) into the nose as needed for migraine or headache. May repeat in 2 hours if headache persists or recurs. 11/15/15 11/14/16  Pierce Crane Taylie Helder, [name]  traZODone (DESYREL) 50 MG tablet  Take 0.5-1 tablets (25-50 mg total) by mouth at bedtime as needed for sleep. 10/17/15   Amy Overton Mam, NP  triamcinolone cream (KENALOG) 0.1 % Apply 1 application topically 2 (two) times daily. 02/14/15   Amy Overton Mam, NP    Allergies Review of patient's allergies indicates no known allergies.  Family History  Problem Relation Age of Onset  . Cancer Sister   . Depression Sister   . Diabetes Maternal Grandmother   . Diabetes Maternal Grandfather   . Stroke Mother   . Heart disease Paternal Grandmother     Social History Social History  Substance Use Topics  . Smoking status: Current Every Day Smoker    Packs/day: 0.50    Years: 20.00    Types: Cigarettes  . Smokeless tobacco: Never Used  . Alcohol use 0.0 oz/week    Review of Systems Constitutional: No fever/chills Eyes: No visual changes. ENT: No sore throat. Cardiovascular: Denies chest pain. Respiratory: Denies shortness of breath. Gastrointestinal: No abdominal pain.  No nausea, no vomiting.  No diarrhea.  No constipation. Genitourinary: Negative for dysuria. Musculoskeletal: Negative for back pain. Skin: Negative for rash. Neurological: Positive for headaches associated with sexual activity., focal weakness or numbness.  10-point ROS otherwise negative.  ____________________________________________   PHYSICAL EXAM:  VITAL SIGNS: ED Triage Vitals  Enc Vitals Group     BP 11/15/15 1007 118/90     Pulse Rate 11/15/15 1007 68     Resp 11/15/15 1007 20  Temp 11/15/15 1007 98.3 F (36.8 C)     Temp Source 11/15/15 1007 Oral     SpO2 11/15/15 1007 100 %     Weight 11/15/15 0954 170 lb (77.1 kg)     Height 11/15/15 0954 5\' 5"  (1.651 m)     Head Circumference --      Peak Flow --      Pain Score --      Pain Loc --      Pain Edu? --      Excl. in [place]? --     Constitutional: Alert and oriented. Well appearing and in no acute distress. Eyes: Conjunctivae are normal. PERRL. EOMI. Head:  Atraumatic. Nose: No congestion/rhinnorhea. Mouth/Throat: Mucous membranes are moist.  Oropharynx non-erythematous. Neck: No stridor.   Cardiovascular: Normal rate, regular rhythm. Grossly normal heart sounds.  Good peripheral circulation. Respiratory: Normal respiratory effort.  No retractions. Lungs CTAB. Musculoskeletal: No lower extremity tenderness nor edema.  No joint effusions. Neurologic:  Normal speech and language. No gross focal neurologic deficits are appreciated. No gait instability. Skin:  Skin is warm, dry and intact. No rash noted. Psychiatric: Mood and affect are normal. Speech and behavior are normal.  ____________________________________________   LABS (all labs ordered are listed, but only abnormal results are displayed)  Labs Reviewed - No data to display ____________________________________________  EKG   ____________________________________________  RADIOLOGY  Head CT negative ____________________________________________   PROCEDURES  Procedure(s) performed: None  Critical Care performed: No  ____________________________________________   INITIAL IMPRESSION / ASSESSMENT AND PLAN / ED COURSE  Pertinent labs & imaging results that were available during my care of the patient were reviewed by me and considered in my medical decision making (see chart for details). Review of the [place] CSRS was performed in accordance of the [place] prior to dispensing any controlled drugs.  Sexual-induced headache. Rx given for Indocin and Imitrex nasal spray. Patient follow-up with PCP or return to ER with any worsening symptomology.  Clinical Course    ____________________________________________   FINAL CLINICAL IMPRESSION(S) / ED DIAGNOSES  Final diagnoses:  Headache associated with sexual activity     This chart was dictated using voice recognition software/Dragon. Despite best efforts to proofread, errors can occur which can change the meaning. Any change  was purely unintentional.    Arlyss Repress, [name] 11/15/15 [place], MD 11/16/15 [phone]

## 2015-11-15 NOTE — Patient Instructions (Signed)
Due to sudden onset of severe headache with exertion, I think your headache should be evaluated in the ER to rule out any life-threatening cause of a headache.   Ideally this is just a headache but due to the severity of symptoms and sudden onset- we need to be careful.

## 2015-11-15 NOTE — ED Notes (Signed)
See triage note  States she developed headache with exertion   Denies any fever n/v/ or trauma

## 2015-11-15 NOTE — ED Triage Notes (Signed)
Pt sent by PCP for sudden onset of headache following exertion only, pt denies any other symptoms

## 2015-12-20 ENCOUNTER — Ambulatory Visit: Payer: 59 | Admitting: Family Medicine

## 2016-01-25 ENCOUNTER — Telehealth: Payer: Self-pay | Admitting: Family Medicine

## 2016-01-25 NOTE — Telephone Encounter (Signed)
Patient has scheduled office visit 01/27/2016.

## 2016-01-25 NOTE — Telephone Encounter (Signed)
Pt needs a referral to see Dr. Sharlet Salina, orthopedic at [facility] for cortisone shot for lumbar disc disease.  Her call back number is [phone]

## 2016-01-27 ENCOUNTER — Inpatient Hospital Stay: Payer: Self-pay | Admitting: Family Medicine

## 2016-02-29 ENCOUNTER — Encounter: Payer: Self-pay | Admitting: Family Medicine

## 2016-02-29 ENCOUNTER — Ambulatory Visit (INDEPENDENT_AMBULATORY_CARE_PROVIDER_SITE_OTHER): Payer: 59 | Admitting: Family Medicine

## 2016-02-29 VITALS — BP 116/86 | HR 75 | Temp 97.6°F | Resp 16 | Ht 65.0 in | Wt 171.0 lb

## 2016-02-29 DIAGNOSIS — L308 Other specified dermatitis: Secondary | ICD-10-CM | POA: Diagnosis not present

## 2016-02-29 DIAGNOSIS — J019 Acute sinusitis, unspecified: Secondary | ICD-10-CM

## 2016-02-29 DIAGNOSIS — J3089 Other allergic rhinitis: Secondary | ICD-10-CM | POA: Diagnosis not present

## 2016-02-29 MED ORDER — FLUTICASONE PROPIONATE 50 MCG/ACT NA SUSP: 2.0000 | Freq: Every day | NASAL | 3 refills | Status: DC

## 2016-02-29 MED ORDER — AMOXICILLIN-POT CLAVULANATE 875-125 MG PO TABS: 1.0000 | ORAL_TABLET | Freq: Two times a day (BID) | ORAL | 0 refills | Status: DC

## 2016-02-29 MED ORDER — LORATADINE 10 MG PO TABS: 10.0000 mg | ORAL_TABLET | Freq: Every day | ORAL | 11 refills | Status: DC

## 2016-02-29 MED ORDER — TRIAMCINOLONE ACETONIDE 0.1 % EX CREA: 1.0000 "application " | TOPICAL_CREAM | Freq: Two times a day (BID) | CUTANEOUS | 3 refills | Status: DC

## 2016-02-29 NOTE — Assessment & Plan Note (Signed)
Refill Triamcinolone 

## 2016-02-29 NOTE — Progress Notes (Signed)
Subjective:    Patient ID: Kayla Alexander, female    DOB: Feb 02, 1971, 45 y.o.   MRN: [id number]  Kayla Alexander is a 45 y.o. female presenting on 02/29/2016 for Cough and Nasal Congestion  Patient presents for a same day appointment.  HPI   SINUSITIS: Reports symptoms started about 4 days ago with nasal and sinus congestion, worsening in past 24-48 hours with increased Right sided sinus pain and pressure, associated Right ear pressure fullness and pain, worse laying down on Right side. - Tried Zicam, OTC sinus decongestant, hot wash cloth with some improvement, never taken allergy medications or nose spray but does have seasonal allergies worse in fall/winter - Recently did have URI cough a few weeks ago but then completely resolved - No known sick contacts - Denies fevers/chills, sweats, nausea, vomiting, abdominal pain, diarrhea, ear pain, sore throat, productive cough  Eczema, - Requests refill of Triamcinolone, has worsening flare in winter  PMH - Generalized Anxiety Disorder, requesting refill on Lorazepam 0.5 PRN  Social History  Substance Use Topics  . Smoking status: Current Every Day Smoker    Packs/day: 0.50    Years: 20.00    Types: Cigarettes  . Smokeless tobacco: Never Used  . Alcohol use 0.0 oz/week    Review of Systems Per HPI unless specifically indicated above     Objective:    BP 116/86 (BP Location: Right Arm, Patient Position: Sitting, Cuff Size: Normal)   Pulse 75   Temp 97.6 F (36.4 C) (Oral)   Resp 16   Ht 5\' 5"  (1.651 m)   Wt 171 lb (77.6 kg)   BMI 28.46 kg/m   Wt Readings from Last 3 Encounters:  02/29/16 171 lb (77.6 kg)  11/15/15 170 lb (77.1 kg)  11/15/15 169 lb (76.7 kg)    Physical Exam  Constitutional: She appears well-developed and well-nourished. No distress.  Mildly ill-appearing but non-toxic, comfortable, cooperative  HENT:  Head: Normocephalic and atraumatic.  Right sided frontal and maxillary sinus mild tender. Nares with  turbinate edema and congestion, without purulence. Bilateral TMs clear without erythema, effusion or bulging. Oropharynx with some posterior pharyngeal edema without exudates, edema or asymmetry.  Mildly dry mucus mem  Eyes: Conjunctivae are normal. Right eye exhibits no discharge. Left eye exhibits no discharge.  Neck: Normal range of motion. Neck supple.  Cardiovascular: Normal rate and intact distal pulses.   Pulmonary/Chest: Effort normal and breath sounds normal. No respiratory distress. She has no wheezes. She has no rales.  Good air movement  Musculoskeletal: She exhibits no edema.  Lymphadenopathy:    She has no cervical adenopathy.  Neurological: She is alert.  Skin: Skin is warm and dry. She is not diaphoretic.  Psychiatric: Her behavior is normal.  Nursing note and vitals reviewed.    I have personally reviewed the following lab results from 10/17/15.  Results for orders placed or performed in visit on 10/17/15  VITAMIN D 25 Hydroxy (Vit-D Deficiency, Fractures)  Result Value Ref Range   Vit D, 25-Hydroxy 44 30 - 100 ng/mL  B12  Result Value Ref Range   Vitamin B-12 237 200 - 1,100 pg/mL  BASIC METABOLIC PANEL WITH GFR  Result Value Ref Range   Sodium 138 135 - 146 mmol/L   Potassium 4.4 3.5 - 5.3 mmol/L   Chloride 105 98 - 110 mmol/L   CO2 25 20 - 31 mmol/L   Glucose, Bld 91 65 - 99 mg/dL   BUN 12 7 - 25 mg/dL  Creat 0.92 0.50 - 1.10 mg/dL   Calcium 8.6 8.6 - 10.2 mg/dL   GFR, Est African American 87 >=60 mL/min   GFR, Est Non African American 75 >=60 mL/min  TSH  Result Value Ref Range   TSH 0.64 mIU/L      Assessment & Plan:   Problem List Items Addressed This Visit    Environmental and seasonal allergies - Likely needs preventative regular anti-histamine and flonase seasonally    Relevant Medications   loratadine (CLARITIN) 10 MG tablet   fluticasone (FLONASE) 50 MCG/ACT nasal spray   Eczema    Refill Triamcinolone      Relevant Medications    triamcinolone cream (KENALOG) 0.1 %    Other Visit Diagnoses    Acute rhinosinusitis    -  Primary  Consistent with acute R frontal / maxillary rhinosinusitis, likely initially viral URI vs allergic rhinitis component with worsening concern for bacterial infection.   Plan: 1. Start Augmentin 875-125mg  PO BID x 10 days 2. Start Loratadine (Claritin) 10mg  daily and Flonase 2 sprays in each nostril daily for next 4-6 weeks, then may stop and use seasonally or as needed 3. Start Mucinex-DM up to 7 days 4. Supportive care with nasal saline OTC, hydration, warm compresses and sinus effleurage as demonstrated 4. Return criteria reviewed    Relevant Medications   amoxicillin-clavulanate (AUGMENTIN) 875-125 MG tablet   loratadine (CLARITIN) 10 MG tablet   fluticasone (FLONASE) 50 MCG/ACT nasal spray      Meds ordered this encounter  Medications  . triamcinolone cream (KENALOG) 0.1 %    Sig: Apply 1 application topically 2 (two) times daily. Up to 2 weeks per flare.    Dispense:  30 g    Refill:  3  . amoxicillin-clavulanate (AUGMENTIN) 875-125 MG tablet    Sig: Take 1 tablet by mouth 2 (two) times daily.    Dispense:  20 tablet    Refill:  0  . loratadine (CLARITIN) 10 MG tablet    Sig: Take 1 tablet (10 mg total) by mouth daily. Use for 4-6 weeks then stop, and use as needed or seasonally    Dispense:  30 tablet    Refill:  11  . fluticasone (FLONASE) 50 MCG/ACT nasal spray    Sig: Place 2 sprays into both nostrils daily. Use for 4-6 weeks then stop and use seasonally or as needed.    Dispense:  16 g    Refill:  3      Follow up plan: Return in about 4 weeks (around 03/28/2016) for anxiety, back pain.  Kayla Alexander, [facility] 02/29/2016, 4:14 PM

## 2016-02-29 NOTE — Patient Instructions (Addendum)
Thank you for coming in to clinic today.  1. It sounds like you have a Sinusitis (Bacterial Infection) - this most likely started as an Upper Respiratory Virus that has settled into an infection. Allergies can also cause this. - Start Augmentin 1 pill twice daily (breakfast and dinner, with food and plenty of water) for 10 days, complete entire course, do not stop early even if feeling better - Start Loratadine (Claritin) 10mg  daily and Flonase 2 sprays in each nostril daily for next 4-6 weeks, then you may stop and use seasonally or as needed - Take OTC Mucinex (or Mucinex-DM cough) few times a day for up to 7 days - Stop OTC Zicam and Decongestant - Recommend to using OTC Simply Saline spray multiple times a day to help flush out congestion and clear sinuses - Improve hydration by drinking plenty of clear fluids (water, gatorade) to reduce secretions and thin congestion - Congestion draining down throat can cause irritation. May try warm herbal tea with honey, cough drops - Can take Tylenol or Ibuprofen as needed for fevers  * Call pharmacy to see if you can fill the existing Lorazepam (Ativan) Rx, it expires 04/14/16*  Please schedule a follow-up appointment with Dr. Parks Ranger within 4 weeks for follow-up Anxiety / Back Pain X-rays  If you have any other questions or concerns, please feel free to call the clinic or send a message through [place]. You may also schedule an earlier appointment if necessary.  Kayla Putnam, DO [place]

## 2016-03-07 ENCOUNTER — Ambulatory Visit: Payer: 59 | Admitting: Family Medicine

## 2016-04-10 ENCOUNTER — Other Ambulatory Visit: Payer: Self-pay | Admitting: Family Medicine

## 2016-04-10 DIAGNOSIS — J3089 Other allergic rhinitis: Secondary | ICD-10-CM

## 2016-04-10 DIAGNOSIS — J019 Acute sinusitis, unspecified: Secondary | ICD-10-CM

## 2016-04-13 DIAGNOSIS — J019 Acute sinusitis, unspecified: Secondary | ICD-10-CM | POA: Diagnosis not present

## 2016-08-21 DIAGNOSIS — R5383 Other fatigue: Secondary | ICD-10-CM | POA: Diagnosis not present

## 2016-11-22 DIAGNOSIS — Z01419 Encounter for gynecological examination (general) (routine) without abnormal findings: Secondary | ICD-10-CM | POA: Diagnosis not present

## 2016-11-22 DIAGNOSIS — Z124 Encounter for screening for malignant neoplasm of cervix: Secondary | ICD-10-CM | POA: Diagnosis not present

## 2016-11-28 LAB — HM PAP SMEAR: HM Pap smear: NORMAL

## 2017-01-09 ENCOUNTER — Ambulatory Visit (INDEPENDENT_AMBULATORY_CARE_PROVIDER_SITE_OTHER): Payer: 59 | Admitting: Family Medicine

## 2017-01-09 ENCOUNTER — Ambulatory Visit
Admission: RE | Admit: 2017-01-09 | Discharge: 2017-01-09 | Disposition: A | Discharge status: Home / Self Care | Payer: 59 | Source: Ambulatory Visit | Attending: Family Medicine | Admitting: Family Medicine

## 2017-01-09 ENCOUNTER — Encounter: Payer: Self-pay | Admitting: Family Medicine

## 2017-01-09 VITALS — BP 109/69 | HR 72 | Temp 98.1°F | Resp 16 | Ht 65.0 in | Wt 171.0 lb

## 2017-01-09 DIAGNOSIS — M5136 Other intervertebral disc degeneration, lumbar region: Secondary | ICD-10-CM

## 2017-01-09 DIAGNOSIS — G8929 Other chronic pain: Secondary | ICD-10-CM

## 2017-01-09 DIAGNOSIS — M545 Low back pain: Secondary | ICD-10-CM | POA: Diagnosis not present

## 2017-01-09 DIAGNOSIS — M47816 Spondylosis without myelopathy or radiculopathy, lumbar region: Secondary | ICD-10-CM | POA: Diagnosis not present

## 2017-01-09 DIAGNOSIS — J3089 Other allergic rhinitis: Secondary | ICD-10-CM

## 2017-01-09 MED ORDER — FLUTICASONE PROPIONATE 50 MCG/ACT NA SUSP: NASAL | 3 refills | Status: DC

## 2017-01-09 NOTE — Patient Instructions (Addendum)
Thank you for coming to the clinic today.  1. For your Back Pain - I think that this is due to Muscle Spasms or strain.  X-ray results, will call and then notify you of results  Referral to Ortho - call them within 2 weeks  [facility] [place], [place]  [address] Phone: [phone]  2. Start with anti-inflammatory Aleve OTC (Naproxen) x 2 of the 250mg  tabs for dose 500mg  twice daily (12 hrs apart, with food, breakfast and dinner) every day for next 2 to 4 weeks if helping, then can use only as needed  3. We can offer muscle relaxant if need, can make drowsy caution with sedation if choose to take this med  4. May use Tylenol Extra Str 500mg  tabs - may take 1-2 tablets every 6 hours as needed - 3 times daily  5. Recommend to start using heating pad on your lower back 1-2x daily for few weeks  This pain may take weeks to months to fully resolve, but hopefully it will respond to the medicine initially. All back injuries (small or serious) are slow to heal since we use our back muscles every day. Be careful with turning, twisting, lifting, sitting / standing for prolonged periods, and avoid re-injury.  If your symptoms significantly worsen with more pain, or new symptoms with weakness in one or both legs, new or different shooting leg pains, numbness in legs or groin, loss of control or retention of urine or bowel movements, please call back for advice and you may need to go directly to the Emergency Department.  Please schedule a Follow-up Appointment to: Return in about 6 weeks (around 02/20/2017), or if symptoms worsen or fail to improve, for Back pain if not improved or Anxiety follow-up.  If you have any other questions or concerns, please feel free to call the clinic or send a message through [place]. You may also schedule an earlier appointment if necessary.  Additionally, you may be receiving a survey about your experience at our clinic  within a few days to 1 week by e-mail or mail. We value your feedback.  Nobie Putnam, DO [facility]              Low Back Pain Exercises  See other page with pictures of each exercise.  Start with 1 or 2 of these exercises that you are most comfortable with. Do not do any exercises that cause you significant worsening pain. Some of these may cause some "stretching soreness" but it should go away after you stop the exercise, and get better over time. Gradually increase up to 3-4 exercises as tolerated.  Standing hamstring stretch: Place the heel of your leg on a stool about 15 inches high. Keep your knee straight. Lean forward, bending at the hips until you feel a mild stretch in the back of your thigh. Make sure you do not roll your shoulders and bend at the waist when doing this or you will stretch your lower back instead. Hold the stretch for 15 to 30 seconds. Repeat 3 times. Repeat the same stretch on your other leg.  Cat and camel: Get down on your hands and knees. Let your stomach sag, allowing your back to curve downward. Hold this position for 5 seconds. Then arch your back and hold for 5 seconds. Do 3 sets of 10.  Quadriped Arm/Leg Raises: Get down on your hands and knees. Tighten your abdominal muscles to stiffen  your spine. While keeping your abdominals tight, raise one arm and the opposite leg away from you. Hold this position for 5 seconds. Lower your arm and leg slowly and alternate sides. Do this 10 times on each side.  Pelvic tilt: Lie on your back with your knees bent and your feet flat on the floor. Tighten your abdominal muscles and push your lower back into the floor. Hold this position for 5 seconds, then relax. Do 3 sets of 10.  Partial curl: Lie on your back with your knees bent and your feet flat on the floor. Tighten your stomach muscles and flatten your back against the floor. Tuck your chin to your chest. With your hands  stretched out in front of you, curl your upper body forward until your shoulders clear the floor. Hold this position for 3 seconds. Don't hold your breath. It helps to breathe out as you lift your shoulders up. Relax. Repeat 10 times. Build to 3 sets of 10. To challenge yourself, clasp your hands behind your head and keep your elbows out to the side.  Lower trunk rotation: Lie on your back with your knees bent and your feet flat on the floor. Tighten your abdominal muscles and push your lower back into the floor. Keeping your shoulders down flat, gently rotate your legs to one side, then the other as far as you can. Repeat 10 to 20 times.  Single knee to chest stretch: Lie on your back with your legs straight out in front of you. Bring one knee up to your chest and grasp the back of your thigh. Pull your knee toward your chest, stretching your buttock muscle. Hold this position for 15 to 30 seconds and return to the starting position. Repeat 3 times on each side.  Double knee to chest: Lie on your back with your knees bent and your feet flat on the floor. Tighten your abdominal muscles and push your lower back into the floor. Pull both knees up to your chest. Hold for 5 seconds and repeat 10 to 20 times.

## 2017-01-09 NOTE — Assessment & Plan Note (Signed)
See A&P chronic LBP

## 2017-01-09 NOTE — Progress Notes (Signed)
Subjective:    Patient ID: Kayla Alexander, female    DOB: [birth year], 46 y.o.   MRN: [id number]  Kayla Alexander is a 46 y.o. female presenting on 01/09/2017 for Back Pain (Hx of degernated disc diseases onset 2 weeks, hurts with prolonged standing position)  Patient presents for a same day appointment.  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 - She reports that for past 20-25 years she has done "nothing" about her back. States that she has not had any further imaging, evaluation, treatment rx or OTC (including not taken any Tylenol, Ibuprofen, Aleve) she does not like to take medicines, never tried heating pad, muscle rub, never done physical therapy, no injections or surgery. - Today now she is here for acute visit for back pain, now states recent symptoms started about 2 weeks ago without inciting injury or fall. Worsening persistent problem. Describes pain located Left > Right lower back muscles, aching soreness cramping and spasm with "locking", severity 5-6/10 pain on average and up to 10/10 with intermittent worsening depending on position change and activity, she tries to be cautious to avoid injury, and sometimes if bend forward cannot stand all the way up. No pain radiating to legs. - This flare is worse than previous episodes - Denies any fevers/chills, numbness, tingling, weakness, loss of control bladder/bowel incontinence or retention, unintentional wt loss, night sweats  Additional topic not covered today - Reports she has chronic history of anxiety, and previous PCP was rx Lorazepam, states she may be running  out soon and asked for refill, does not use regularly. Did not discuss this in detail today since this was last minute question at end of acute visit for her back.  Health Maintenance: - Due for Flu Shot, declines today despite counseling on benefits  Depression screen [facility] 2/9 01/09/2017 02/29/2016 02/14/2015  Decreased Interest 0 0 0  Down, Depressed, Hopeless 0 0 0  PHQ - 2 Score 0 0 0  Altered sleeping 0 - -  Tired, decreased energy 0 - -  Change in appetite 0 - -  Feeling bad or failure about yourself  0 - -  Trouble concentrating 0 - -  Moving slowly or fidgety/restless 0 - -  Suicidal thoughts 0 - -  PHQ-9 Score 0 - -  Difficult doing work/chores Not difficult at all - -    Social History  Substance Use Topics  . Smoking status: Current Every Day Smoker    Packs/day: 0.50    Years: 20.00    Types: Cigarettes  . Smokeless tobacco: Current User  . Alcohol use 0.0 oz/week    Review of Systems Per HPI unless specifically indicated above     Objective:    BP 109/69   Pulse 72   Temp 98.1 F (36.7 C) (Oral)   Resp 16   Ht 5\' 5"  (1.651 m)   Wt 171 lb (77.6 kg)   BMI 28.46 kg/m   Wt Readings from Last 3 Encounters:  01/09/17 171 lb (77.6 kg)  02/29/16 171 lb (77.6 kg)  11/15/15 170  lb (77.1 kg)    Physical Exam  Constitutional: She is oriented to person, place, and time. She appears well-developed and well-nourished. No distress.  Well-appearing, comfortable, cooperative  HENT:  Head: Normocephalic and atraumatic.  Mouth/Throat: Oropharynx is clear and moist.  Neck: Normal range of motion.  Cardiovascular: Normal rate and intact distal pulses.   No murmur heard. Pulmonary/Chest: Effort normal and breath sounds normal. No respiratory distress. She has no wheezes. She has no rales.  Musculoskeletal: She exhibits no edema.  Low Back Inspection: Normal appearance, no spinal deformity, symmetrical. Palpation: No tenderness over spinous processes. Bilateral lower  lumbar paraspinal muscles L > R with mild-tender and with hypertonicity/spasm extending into upper lumbar paraspinal ROM: limited back extension at baseline due to discomfort, reduced and limited forward flexion, rotation L/R without discomfort but some stiffness Special Testing: Seated SLR negative for radicular pain bilaterally but some reproduction of L low back pain with L SLR. Standing facet load test positive L>R back pain Strength: Bilateral hip flex/ext 5/5, knee flex/ext 5/5, ankle dorsiflex/plantarflex 5/5 Neurovascular: intact distal sensation to light touch  Gait is normal but seems to be slow and cautious  Neurological: She is alert and oriented to person, place, and time.  +2 DTR bilateral patella, +1 achilles  Skin: Skin is warm and dry. No rash noted. She is not diaphoretic. No erythema.  Psychiatric: Her behavior is normal.  Well groomed, good eye contact, normal speech and thoughts.  Nursing note and vitals reviewed.  I have personally reviewed the radiology report from 01/09/17 Lumbar Spine X-ray.  CLINICAL DATA:  Low back pain increased over the past 2 weeks, no known injury, initial encounter  EXAM: LUMBAR SPINE - COMPLETE 4+ VIEW  COMPARISON:  None.  FINDINGS: Five lumbar type vertebral bodies are well visualized. Vertebral body height is well maintained. No pars defects are noted. No anterolisthesis is seen. Disc space narrowing is noted at L4-5 and to a greater degree at L5-S1.  IMPRESSION: Mild degenerative change without acute abnormality.   Electronically Signed   By: Inez Catalina M.D.   On: 01/09/2017 16:23  Results for orders placed or performed in visit on 10/17/15  VITAMIN D 25 Hydroxy (Vit-D Deficiency, Fractures)  Result Value Ref Range   Vit D, 25-Hydroxy 44 30 - 100 ng/mL  B12  Result Value Ref Range   Vitamin B-12 237 200 - 1,100 pg/mL  BASIC METABOLIC PANEL WITH GFR  Result Value Ref Range   Sodium 138 135 - 146 mmol/L    Potassium 4.4 3.5 - 5.3 mmol/L   Chloride 105 98 - 110 mmol/L   CO2 25 20 - 31 mmol/L   Glucose, Bld 91 65 - 99 mg/dL   BUN 12 7 - 25 mg/dL   Creat 0.92 0.50 - 1.10 mg/dL   Calcium 8.6 8.6 - 10.2 mg/dL   GFR, Est African American 87 >=60 mL/min   GFR, Est Non African American 75 >=60 mL/min  TSH  Result Value Ref Range   TSH 0.64 mIU/L      Assessment & Plan:   Problem List Items Addressed This Visit    Chronic low back pain - Primary    Subacute on chronic L>R LBP without associated sciatica Suspect likely due to muscle spasm/strain, without known injury or trauma. In setting of known chronic LBP with DDD now >20-25 years - No red flag symptoms. Negative SLR for radiculopathy - Dramatically inadequate conservative therapy  - No imaging available  Plan: 1.  Discussion on diagnosis of DDD/DJD of spine, and reviewed her initial dx years ago, reviewed basic treatment outline for arthritis from modifying activity, exercise, medications, physical therapy, injections, and ultimately surgery 2. Check X-ray lumbar spine - given chronicity of injury, eval OA/DDD and for any other potential injuries, no trauma less likely fracture. Patient preference for X-ray 3. Offered variety of medication options including just OTC recommendations Tylenol, NSAID, she declined all medications, seems to have discrepancy with treatment expectations for her chronic problem 4. Referral to [place] for re-evaluation, patient request, advised her she would most likely need to start with meds + PT first, and future can consider advanced imaging if indicated 5. Follow-up only as needed  UPDATE - reviewed X-ray results, showed mild DDD without acute abnormality, disc narrowing L4-5 and increased at L5-S1. Office will contact patient with results tomorrow, and referral placed.      Relevant Orders   DG Lumbar Spine Complete (Completed)   Ambulatory referral to Orthopedic Surgery   DDD (degenerative disc  disease), lumbar    See A&P chronic LBP      Relevant Orders   Ambulatory referral to Orthopedic Surgery   Environmental and seasonal allergies   Relevant Medications   fluticasone (FLONASE) 50 MCG/ACT nasal spray      Meds ordered this encounter  Medications  . fluticasone (FLONASE) 50 MCG/ACT nasal spray    Sig: instill 2 sprays into each nostril once daily    Dispense:  16 g    Refill:  3   Follow up plan: Return in about 6 weeks (around 02/20/2017), or if symptoms worsen or fail to improve, for Back pain if not improved or Anxiety follow-up.  Nobie Putnam, [facility] 01/09/2017, 9:57 PM

## 2017-01-09 NOTE — Assessment & Plan Note (Addendum)
Subacute on chronic L>R LBP without associated sciatica Suspect likely due to muscle spasm/strain, without known injury or trauma. In setting of known chronic LBP with DDD now >20-25 years - No red flag symptoms. Negative SLR for radiculopathy - Dramatically inadequate conservative therapy  - No imaging available  Plan: 1. Discussion on diagnosis of DDD/DJD of spine, and reviewed her initial dx years ago, reviewed basic treatment outline for arthritis from modifying activity, exercise, medications, physical therapy, injections, and ultimately surgery 2. Check X-ray lumbar spine - given chronicity of injury, eval OA/DDD and for any other potential injuries, no trauma less likely fracture. Patient preference for X-ray 3. Offered variety of medication options including just OTC recommendations Tylenol, NSAID, she declined all medications, seems to have discrepancy with treatment expectations for her chronic problem 4. Referral to [place] for re-evaluation, patient request, advised her she would most likely need to start with meds + PT first, and future can consider advanced imaging if indicated 5. Follow-up only as needed  UPDATE - reviewed X-ray results, showed mild DDD without acute abnormality, disc narrowing L4-5 and increased at L5-S1. Office will contact patient with results tomorrow, and referral placed.

## 2017-01-12 DIAGNOSIS — J019 Acute sinusitis, unspecified: Secondary | ICD-10-CM | POA: Diagnosis not present

## 2017-06-16 DIAGNOSIS — H9203 Otalgia, bilateral: Secondary | ICD-10-CM | POA: Diagnosis not present

## 2017-07-24 DIAGNOSIS — L570 Actinic keratosis: Secondary | ICD-10-CM | POA: Diagnosis not present

## 2017-07-24 DIAGNOSIS — L578 Other skin changes due to chronic exposure to nonionizing radiation: Secondary | ICD-10-CM | POA: Diagnosis not present

## 2017-07-24 DIAGNOSIS — L812 Freckles: Secondary | ICD-10-CM | POA: Diagnosis not present

## 2017-10-18 DIAGNOSIS — L03211 Cellulitis of face: Secondary | ICD-10-CM | POA: Diagnosis not present

## 2017-10-28 DIAGNOSIS — M2550 Pain in unspecified joint: Secondary | ICD-10-CM | POA: Diagnosis not present

## 2017-11-03 ENCOUNTER — Emergency Department
Admission: EM | Admit: 2017-11-03 | Discharge: 2017-11-03 | Disposition: A | Discharge status: Home / Self Care | Payer: 59 | Attending: Emergency Medicine | Admitting: Emergency Medicine

## 2017-11-03 ENCOUNTER — Other Ambulatory Visit: Payer: Self-pay

## 2017-11-03 ENCOUNTER — Encounter: Payer: Self-pay | Admitting: Emergency Medicine

## 2017-11-03 DIAGNOSIS — M25512 Pain in left shoulder: Secondary | ICD-10-CM | POA: Diagnosis not present

## 2017-11-03 DIAGNOSIS — M159 Polyosteoarthritis, unspecified: Secondary | ICD-10-CM | POA: Insufficient documentation

## 2017-11-03 DIAGNOSIS — M25551 Pain in right hip: Secondary | ICD-10-CM | POA: Diagnosis not present

## 2017-11-03 DIAGNOSIS — Z79899 Other long term (current) drug therapy: Secondary | ICD-10-CM | POA: Insufficient documentation

## 2017-11-03 DIAGNOSIS — F17210 Nicotine dependence, cigarettes, uncomplicated: Secondary | ICD-10-CM | POA: Diagnosis not present

## 2017-11-03 DIAGNOSIS — M25511 Pain in right shoulder: Secondary | ICD-10-CM | POA: Diagnosis not present

## 2017-11-03 DIAGNOSIS — M2550 Pain in unspecified joint: Secondary | ICD-10-CM

## 2017-11-03 LAB — SEDIMENTATION RATE: SED RATE: 8 mm/h (ref 0–20)

## 2017-11-03 LAB — CBC WITH DIFFERENTIAL/PLATELET
BASOS PCT: 1 %
Basophils Absolute: 0 10*3/uL (ref 0–0.1)
EOS PCT: 2 %
Eosinophils Absolute: 0.1 10*3/uL (ref 0–0.7)
HCT: 42 % (ref 35.0–47.0)
Hemoglobin: 14.7 g/dL (ref 12.0–16.0)
LYMPHS PCT: 26 %
Lymphs Abs: 1.9 10*3/uL (ref 1.0–3.6)
MCH: 33.3 pg (ref 26.0–34.0)
MCHC: 35.1 g/dL (ref 32.0–36.0)
MCV: 94.9 fL (ref 80.0–100.0)
MONO ABS: 0.5 10*3/uL (ref 0.2–0.9)
Monocytes Relative: 7 %
Neutro Abs: 4.8 10*3/uL (ref 1.4–6.5)
Neutrophils Relative %: 64 %
PLATELETS: 261 10*3/uL (ref 150–440)
RBC: 4.42 MIL/uL (ref 3.80–5.20)
RDW: 13 % (ref 11.5–14.5)
WBC: 7.3 10*3/uL (ref 3.6–11.0)

## 2017-11-03 LAB — BASIC METABOLIC PANEL
ANION GAP: 8 (ref 5–15)
BUN: 16 mg/dL (ref 6–20)
CALCIUM: 8.9 mg/dL (ref 8.9–10.3)
CO2: 26 mmol/L (ref 22–32)
Chloride: 108 mmol/L (ref 98–111)
Creatinine, Ser: 0.76 mg/dL (ref 0.44–1.00)
GFR calc non Af Amer: >60 mL/min (ref >60)
GLUCOSE: 92 mg/dL (ref 70–99)
Potassium: 4.2 mmol/L (ref 3.5–5.1)
SODIUM: 142 mmol/L (ref 135–145)

## 2017-11-03 MED ORDER — PREDNISONE 20 MG PO TABS: 40.0000 mg | ORAL_TABLET | Freq: Every day | ORAL | 0 refills | Status: DC

## 2017-11-03 MED ORDER — PREDNISONE 20 MG PO TABS: 60.0000 mg | ORAL_TABLET | Freq: Once | ORAL | Status: DC

## 2017-11-03 NOTE — ED Triage Notes (Signed)
Pt to ED via POV, pt states that about 3 weeks ago she was diagnosed with a staph infection in her nose. Pt states that since then she had been having joint pain. Pt states that the pain is worse in her hips. Pt states that over the weekend she has felt more fatigue and had been having some nausea. Pt denies fever but states that she has been having episodes of sweating. Pt is in NAD at this time.

## 2017-11-03 NOTE — ED Notes (Signed)
Lab notified about add on test.

## 2017-11-03 NOTE — ED Notes (Signed)
Track pad does not work. Pt verbally understood discharge medications and follow ups.

## 2017-11-03 NOTE — ED Provider Notes (Signed)
[facility] Emergency Department Provider Note  Time seen: 3:46 PM  I have reviewed the triage vital signs and the nursing notes.   HISTORY  Chief Complaint Joint Pain    HPI Kayla Alexander is a 47 y.o. female with a past medical history of arthritis, allergies, gastric reflux, anxiety, presents to the emergency department for joint pain.  According to the patient 3 weeks ago she developed a staph infection of her nose and face.  Was on clindamycin for 10 days.  She states over the past 1 week or so she has been experiencing a lot of joint pain and stiffness throughout her body especially in her shoulders and hips.  Denies any fever.  States over the weekend she was feeling more weak, she became concerned because she thought this could be due to the staph infection that might be spreading so she came to the emergency department for evaluation.  Patient denies any fever at any point.  Denies any specific pain to any specific joint but states it is hurting in all of her joints especially her shoulders, back, hips.  No vomiting no diarrhea.  No chest pain or abdominal pain.  Largely negative review of systems.   Past Medical History:  Diagnosis Date  . Allergy   . Arthritis   . Depression   . GERD (gastroesophageal reflux disease)   . Gravida 2 para 2     Patient Active Problem List   Diagnosis Date Noted  . Environmental and seasonal allergies 02/29/2016  . Insomnia 10/17/2015  . GERD (gastroesophageal reflux disease) 02/14/2015  . Chronic low back pain 02/14/2015  . IBS (irritable bowel syndrome) 02/14/2015  . Anxiety and depression 02/14/2015  . Eczema 02/14/2015  . Smoker 02/14/2015  . 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 DDD (degenerative disc disease), lumbar 06/26/2013    Past Surgical History:  Procedure Laterality Date  . Ablasion     . ENDOMETRIAL ABLATION Bilateral 2011    Prior to Admission medications   Medication Sig Start Date End Date Taking?  Authorizing Provider  fluticasone (FLONASE) 50 MCG/ACT nasal spray instill 2 sprays into each nostril once daily 01/09/17   Karamalegos, Devonne Doughty, DO  loratadine (CLARITIN) 10 MG tablet Take 1 tablet (10 mg total) by mouth daily. Use for 4-6 weeks then stop, and use as needed or seasonally 02/29/16   Karamalegos, Devonne Doughty, DO  LORazepam (ATIVAN) 0.5 MG tablet Take 1 tablet (0.5 mg total) by mouth as needed for anxiety. 10/17/15   Luciana Axe, NP  traZODone (DESYREL) 50 MG tablet Take 0.5-1 tablets (25-50 mg total) by mouth at bedtime as needed for sleep. Patient not taking: Reported on 01/09/2017 10/17/15   Luciana Axe, NP  triamcinolone cream (KENALOG) 0.1 % Apply 1 application topically 2 (two) times daily. Up to 2 weeks per flare. 02/29/16   Karamalegos, Devonne Doughty, DO    No Known Allergies  Family History  Problem Relation Age of Onset  . Cancer Sister   . Depression Sister   . Diabetes Maternal Grandmother   . Diabetes Maternal Grandfather   . Stroke Mother   . Heart disease Paternal Grandmother     Social History Social History   Tobacco Use  . Smoking status: Current Every Day Smoker    Packs/day: 0.50    Years: 20.00    Pack years: 10.00    Types: Cigarettes  . Smokeless tobacco: Current User  Substance Use Topics  . Alcohol use: Yes  Alcohol/week: 0.0 standard drinks  . Drug use: No    Review of Systems Constitutional: Negative for fever. Cardiovascular: Negative for chest pain. Respiratory: Negative for shortness of breath. Gastrointestinal: Negative for abdominal pain, vomiting and diarrhea. Genitourinary: Negative for urinary compaints Musculoskeletal: Pain in her shoulders and hips Skin: Negative for skin complaints  Neurological: Negative for headache All other ROS negative  ____________________________________________   PHYSICAL EXAM:  VITAL SIGNS: ED Triage Vitals  Enc Vitals Group     BP 11/03/17 1447 (!) 132/92     Pulse Rate  11/03/17 1447 76     Resp 11/03/17 1447 16     Temp 11/03/17 1447 98.6 F (37 C)     Temp Source 11/03/17 1447 Oral     SpO2 11/03/17 1447 97 %     Weight 11/03/17 1448 160 lb (72.6 kg)     Height 11/03/17 1448 _0  (1.676 m)     Head Circumference --      Peak Flow --      Pain Score 11/03/17 1448 8     Pain Loc --      Pain Edu? --      Excl. in [place]? --    Constitutional: Alert and oriented. Well appearing and in no distress. Eyes: Normal exam ENT   Head: Normocephalic and atraumatic.   Mouth/Throat: Mucous membranes are moist. Cardiovascular: Normal rate, regular rhythm. No murmur Respiratory: Normal respiratory effort without tachypnea nor retractions. Breath sounds are clear  Gastrointestinal: Soft and nontender. No distention.   Musculoskeletal: Nontender with normal range of motion in all extremities.  Unable to elicit any specific tenderness. Neurologic:  Normal speech and language. No gross focal neurologic deficits  Skin:  Skin is warm, dry and intact.  Psychiatric: Mood and affect are normal.   ____________________________________________   INITIAL IMPRESSION / ASSESSMENT AND PLAN / ED COURSE  Pertinent labs & imaging results that were available during my care of the patient were reviewed by me and considered in my medical decision making (see chart for details).  Patient presents to the emergency department for evaluation of joint pain and stiffness.  Differential would include arthritis, inflammatory arthritis, septic arthritis, muscular skeletal pain.  We will check labs including an ESR.  Patient's vitals are reassuring.  Exam is reassuring.  Patient's labs including white blood cell count are normal.  ESR is pending.  I discussed with the patient trial of prednisone to see if this helps with her joint stiffness and pain.  Patient states she Artie has anxiety and does not want to take prednisone because she heard that it can worsen her anxiety.  I discussed  with the patient we will write her a prescription for prednisone if the pain is stiffness does not improve over the next few days and the patient should feel begin taking this prescription and follow-up with her doctor this week.  Patient agreeable to plan of care.  Currently awaiting ESR.  ESR is normal.  We will discharge with PCP follow-up.  ____________________________________________   FINAL CLINICAL IMPRESSION(S) / ED DIAGNOSES  Joint pain    Harvest Dark, MD 11/03/17 [phone]

## 2017-11-11 ENCOUNTER — Ambulatory Visit: Payer: 59 | Admitting: Family Medicine

## 2017-11-11 ENCOUNTER — Encounter: Payer: Self-pay | Admitting: Family Medicine

## 2017-11-11 VITALS — BP 120/76 | HR 73 | Temp 98.1°F | Ht 67.0 in | Wt 180.6 lb

## 2017-11-11 DIAGNOSIS — F32A Depression, unspecified: Secondary | ICD-10-CM

## 2017-11-11 DIAGNOSIS — E663 Overweight: Secondary | ICD-10-CM

## 2017-11-11 DIAGNOSIS — F410 Panic disorder [episodic paroxysmal anxiety] without agoraphobia: Secondary | ICD-10-CM | POA: Diagnosis not present

## 2017-11-11 DIAGNOSIS — F419 Anxiety disorder, unspecified: Secondary | ICD-10-CM

## 2017-11-11 DIAGNOSIS — J3089 Other allergic rhinitis: Secondary | ICD-10-CM

## 2017-11-11 DIAGNOSIS — Z720 Tobacco use: Secondary | ICD-10-CM | POA: Diagnosis not present

## 2017-11-11 DIAGNOSIS — F329 Major depressive disorder, single episode, unspecified: Secondary | ICD-10-CM

## 2017-11-11 MED ORDER — PHENTERMINE HCL 37.5 MG PO CAPS
37.5000 mg | ORAL_CAPSULE | ORAL | 2 refills | Status: DC
Start: 2017-11-11 — End: 2018-12-31

## 2017-11-11 MED ORDER — LORAZEPAM 0.5 MG PO TABS: 0.5000 mg | ORAL_TABLET | Freq: Every day | ORAL | 0 refills | Status: DC | PRN

## 2017-11-11 NOTE — Patient Instructions (Signed)
1-800-Quit-Now  Health Risks of Smoking Smoking cigarettes is very bad for your health. Tobacco smoke has over 200 known poisons in it. It contains the poisonous gases nitrogen oxide and carbon monoxide. There are over 60 chemicals in tobacco smoke that cause cancer. Smoking is difficult to quit because a chemical in tobacco, called nicotine, causes addiction or dependence. When you smoke and inhale, nicotine is absorbed rapidly into the bloodstream through your lungs. Both inhaled and non-inhaled nicotine may be addictive. What are the risks of cigarette smoke? Cigarette smokers have an increased risk of many serious medical problems, including:  Lung cancer.  Lung disease, such as pneumonia, bronchitis, and emphysema.  Chest pain (angina) and heart attack because the heart is not getting enough oxygen.  Heart disease and peripheral blood vessel disease.  High blood pressure (hypertension).  Stroke.  Oral cancer, including cancer of the lip, mouth, or voice box.  Bladder cancer.  Pancreatic cancer.  Cervical cancer.  Pregnancy complications, including premature birth.  Stillbirths and smaller newborn babies, birth defects, and genetic damage to sperm.  Early menopause.  Lower estrogen level for women.  Infertility.  Facial wrinkles.  Blindness.  Increased risk of broken bones (fractures).  Senile dementia.  Stomach ulcers and internal bleeding.  Delayed wound healing and increased risk of complications during surgery.  Even smoking lightly shortens your life expectancy by several years.  Because of secondhand smoke exposure, children of smokers have an increased risk of the following:  Sudden infant death syndrome (SIDS).  Respiratory infections.  Lung cancer.  Heart disease.  Ear infections.  What are the benefits of quitting? There are many health benefits of quitting smoking. Here are some of them:  Within days of quitting smoking, your risk of  having a heart attack decreases, your blood flow improves, and your lung capacity improves. Blood pressure, pulse rate, and breathing patterns start returning to normal soon after quitting.  Within months, your lungs may clear up completely.  Quitting for 10 years reduces your risk of developing lung cancer and heart disease to almost that of a nonsmoker.  People who quit may see an improvement in their overall quality of life.  How do I quit smoking? Smoking is an addiction with both physical and psychological effects, and longtime habits can be hard to change. Your health care provider can recommend:  Programs and community resources, which may include group support, education, or talk therapy.  Prescription medicines to help reduce cravings.  Nicotine replacement products, such as patches, gum, and nasal sprays. Use these products only as directed. Do not replace cigarette smoking with electronic cigarettes, which are commonly called e-cigarettes. The safety of e-cigarettes is not known, and some may contain harmful chemicals.  A combination of two or more of these methods.  Where to find more information:  American Lung Association: www.lung.org  American Cancer Society: www.cancer.org Summary  Smoking cigarettes is very bad for your health. Cigarette smokers have an increased risk of many serious medical problems, including several cancers, heart disease, and stroke.  Smoking is an addiction with both physical and psychological effects, and longtime habits can be hard to change.  By stopping right away, you can greatly reduce the risk of medical problems for you and your family.  To help you quit smoking, your health care provider can recommend programs, community resources, prescription medicines, and nicotine replacement products such as patches, gum, and nasal sprays. This information is not intended to replace advice given to you by  your health care provider. Make sure you  discuss any questions you have with your health care provider. Document Released: 04/19/2004 Document Revised: 03/16/2016 Document Reviewed: 03/16/2016 Elsevier Interactive Patient Education  2017 [employer].

## 2017-11-11 NOTE — Progress Notes (Signed)
Patient: Kayla Alexander, Female    DOB: November 12, 1970, 47 y.o.   MRN: [id number] Visit Date: 11/11/2017  Today's Provider: Lavon Paganini, MD   Chief Complaint  Patient presents with  . Establish Care   Subjective:  I, Tiburcio Pea, CMA, am acting as a scribe for Lavon Paganini, MD.   Kayla Alexander is a 47 year old female who presents today to [facility] as a new patient. Patient has been seeing Dr. Elgie Collard at [facility] for many years. She then was seen at [facility] (felt this was not a good fit).    Patient denies any issues or concerns today. Patient reports feeling well. She is not exercising. She states she is sleeping well.   GERD: Patient has a history of GERD.  She states she stopped drinking Diet Coke and now it is well controlled.  She is not currently taking any medication for this.  Depression anxiety: This has been a long-standing issue for the patient, but is been much better for the past few years.  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  She states that avoiding stressful situations and other people's problems has helped her control her depression and anxiety.  She denies SI/HI, AVH, manic symptoms.  She states there is a family history of multiple people with panic attacks and anxiety.  She states she will have rare panic attacks when she has an overwhelmingly large amount of stress.  She states that this is often worse at night.  She has a prescription for Lorazepam from 2017 that she keeps on her just in case anything happens.  She has not used this in the last year.  She states that she is tried Wellbutrin and some other medications in the past but does not remember the names.  She states she did not do well on a daily medication for her anxiety.  Seasonal allergies: Patient uses OTC antihistamine as needed.  She states she gets a sinus  infection 1-2 times during the year where she needs antibiotic and nasal spray.  Lumbar DJD: Patient states she has chronic and intermittent low back pain from degenerative disc disease.  She uses lidocaine patches which seem to help.  She does not use any prescription pain medications.  Patient is smoking half pack per day of cigarettes.  She has smoked for about 30 years.  She states she is not ready to quit currently.  She likes to smoke in the evenings after work.  She finds this to be a good stress reliever.  She has never tried Zyban or Chantix for quitting smoking.  She did take Wellbutrin for her anxiety in the past. -----------------------------------------------------------------   Review of Systems  Constitutional: Negative.   HENT: Negative.   Eyes: Negative.   Respiratory: Negative.   Cardiovascular: Negative.   Gastrointestinal: Negative.   Endocrine: Negative.   Genitourinary: Negative.   Musculoskeletal: Negative.   Skin: Negative.   Allergic/Immunologic: Negative.   Neurological: Negative.   Hematological: Negative.   Psychiatric/Behavioral: Negative.     Social History      She  reports that she has been smoking cigarettes. She has a 10.00 pack-year smoking history. She uses smokeless tobacco. She reports that she drinks about 12.0 standard drinks of alcohol per week. She reports that she does not use drugs.  Social History   Socioeconomic History  . Marital status: Divorced    Spouse name: Not on file  . Number of children: Not on file  . Years of education: Not on file  . Highest education level: Not on file  Occupational History  . Not on file  Social Needs  . Financial resource strain: Not on file  . Food insecurity:    Worry: Not on file    Inability: Not on file  . Transportation needs:    Medical: Not on file    Non-medical: Not on file  Tobacco Use  . Smoking status: Current Every Day Smoker    Packs/day: 0.50    Years: 20.00    Pack  years: 10.00    Types: Cigarettes  . Smokeless tobacco: Current User  Substance and Sexual Activity  . Alcohol use: Yes    Alcohol/week: 12.0 standard drinks    Types: 12 Cans of beer per week  . Drug use: No  . Sexual activity: Not on file  Lifestyle  . Physical activity:    Days per week: Not on file    Minutes per session: Not on file  . Stress: Not on file  Relationships  . Social connections:    Talks on phone: Not on file    Gets together: Not on file    Attends religious service: Not on file    Active member of club or organization: Not on file    Attends meetings of clubs or organizations: Not on file    Relationship status: Not on file  Other Topics Concern  . Not on file  Social History Narrative  . Not on file    Past Medical History:  Diagnosis Date  . Allergy   . Anxiety   . Arthritis   . Depression   . GERD (gastroesophageal reflux disease)   . Gravida 2 para 2      Patient Active Problem List   Diagnosis Date Noted  . Environmental and seasonal allergies 02/29/2016  . Insomnia 10/17/2015  . GERD (gastroesophageal reflux disease) 02/14/2015  . Chronic low back pain 02/14/2015  . IBS (irritable bowel syndrome) 02/14/2015  . Anxiety and depression 02/14/2015  . Eczema 02/14/2015  . Smoker 02/14/2015  . 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 DDD (degenerative disc disease), lumbar 06/26/2013    Past Surgical History:  Procedure Laterality Date  . Ablasion     . ENDOMETRIAL ABLATION Bilateral 2011    Family History        Family Status  Relation Name Status  . Sister  Deceased  . MGM  Deceased  . MGF  Deceased  . Mother  Alive  . PGM  (Not Specified)  . Father  Alive  . Brother  Deceased  . Mat Aunt  (Not Specified)  . Annamarie Major  (Not Specified)  . PGF  (Not Specified)        Her family history includes Anxiety disorder in her paternal grandmother and paternal uncle; Cancer in her brother, paternal grandmother, and sister; Depression in her  maternal aunt and sister; Diabetes in her maternal grandfather, maternal grandmother, mother, and paternal grandfather; Healthy in her father; Heart disease in her paternal grandmother; Hypertension in her mother; Panic disorder in her paternal grandmother and paternal uncle; Stroke in her mother.      No Known Allergies   Current Outpatient Medications:  .  fluticasone (FLONASE) 50 MCG/ACT nasal spray, instill 2 sprays into each nostril once daily, Disp:  16 g, Rfl: 3 .  loratadine (CLARITIN) 10 MG tablet, Take 1 tablet (10 mg total) by mouth daily. Use for 4-6 weeks then stop, and use as needed or seasonally, Disp: 30 tablet, Rfl: 11 .  LORazepam (ATIVAN) 0.5 MG tablet, Take 1 tablet (0.5 mg total) by mouth as needed for anxiety., Disp: 30 tablet, Rfl: 2   Patient Care Team: Virginia Crews, MD as PCP - General (Family Medicine)      Objective:   Vitals: BP 120/76 (BP Location: Right Arm, Patient Position: Sitting, Cuff Size: Normal)   Pulse 73   Temp 98.1 F (36.7 C) (Oral)   Ht 5\' 7"  (1.702 m)   Wt 180 lb 9.6 oz (81.9 kg)   SpO2 97%   BMI 28.29 kg/m    Vitals:   11/11/17 1507  BP: 120/76  Pulse: 73  Temp: 98.1 F (36.7 C)  TempSrc: Oral  SpO2: 97%  Weight: 180 lb 9.6 oz (81.9 kg)  Height: 5\' 7"  (1.702 m)     Physical Exam  Constitutional: She is oriented to person, place, and time. She appears well-developed and well-nourished. No distress.  HENT:  Head: Normocephalic and atraumatic.  Right Ear: External ear normal.  Left Ear: External ear normal.  Nose: Nose normal.  Mouth/Throat: Oropharynx is clear and moist.  Eyes: Pupils are equal, round, and reactive to light. Conjunctivae and EOM are normal. No scleral icterus.  Neck: Neck supple. No thyromegaly present.  Cardiovascular: Normal rate, regular rhythm, normal heart sounds and intact distal pulses.  No murmur heard. Pulmonary/Chest: Effort normal and breath sounds normal. No respiratory distress. She has  no wheezes. She has no rales.  Abdominal: Soft. Bowel sounds are normal. She exhibits no distension. There is no tenderness. There is no rebound and no guarding.  Musculoskeletal: She exhibits no edema or deformity.  Lymphadenopathy:    She has no cervical adenopathy.  Neurological: She is alert and oriented to person, place, and time.  Skin: Skin is warm and dry. Capillary refill takes less than 2 seconds. No rash noted.  Psychiatric: She has a normal mood and affect. Her behavior is normal.  Vitals reviewed.    Depression Screen PHQ 2/9 Scores 11/11/2017 01/09/2017 02/29/2016 02/14/2015  PHQ - 2 Score 0 0 0 0  PHQ- 9 Score 3 0 - -      Assessment & Plan:    Problem List Items Addressed This Visit      Other   Anxiety and depression - Primary    Chronic problem, but currently well controlled without medication For her intermittent panic attacks, she can continue to have Lorazepam as needed Discussed that if this becomes a more frequent medication, we will need to consider SSRI therapy again She is okay with very small prescription for Ativan as she does not need to use this regularly No SI/HI-contracted for safety Discussed role of therapy and treatment of anxiety      Relevant Medications   LORazepam (ATIVAN) 0.5 MG tablet   Tobacco abuse    5-minute discussion of smoking cessation counseling given Patient encouraged to use resources such as [place] quit line Discussed the harms of smoking including increased risk of cancers, lung disease, heart disease Handouts given Patient not ready to quit at this time, but could consider Chantix in the future      Environmental and seasonal allergies    Well-controlled and asymptomatic Continue Flonase and Claritin as needed      Panic attack  Rare panic attacks As above for anxiety, okay to use Lorazepam as needed and a very sparing nature Prescription for Ativan #5 sent to the pharmacy Discussed that this is not for regular  use Discussed role of therapy      Relevant Medications   LORazepam (ATIVAN) 0.5 MG tablet   Overweight    Discussed healthy weight management, BMI measures Discussed diet and exercise Discussed options for weight loss medications Patient will try phentermine for 3 months Discussed that after 3 months she does need to come back off of this Discussed that often people have rebound weight gain after coming off of phentermine, and the importance of maintaining diet and exercise when she comes off of this medication We will follow-up in 3 months to see if she has been successful in weight loss          Return in about 3 months (around 02/11/2018) for weight f/u.   The entirety of the information documented in the History of Present Illness, Review of Systems and Physical Exam were personally obtained by me. Portions of this information were initially documented by Tiburcio Pea, CMA and reviewed by me for thoroughness and accuracy.    Virginia Crews, MD, MPH [facility] 11/12/2017 10:52 AM

## 2017-11-12 DIAGNOSIS — E663 Overweight: Secondary | ICD-10-CM | POA: Insufficient documentation

## 2017-11-12 NOTE — Assessment & Plan Note (Signed)
Chronic problem, but currently well controlled without medication For her intermittent panic attacks, she can continue to have Lorazepam as needed Discussed that if this becomes a more frequent medication, we will need to consider SSRI therapy again She is okay with very small prescription for Ativan as she does not need to use this regularly No SI/HI-contracted for safety Discussed role of therapy and treatment of anxiety

## 2017-11-12 NOTE — Assessment & Plan Note (Signed)
5-minute discussion of smoking cessation counseling given Patient encouraged to use resources such as [place] quit line Discussed the harms of smoking including increased risk of cancers, lung disease, heart disease Handouts given Patient not ready to quit at this time, but could consider Chantix in the future

## 2017-11-12 NOTE — Assessment & Plan Note (Signed)
Discussed healthy weight management, BMI measures Discussed diet and exercise Discussed options for weight loss medications Patient will try phentermine for 3 months Discussed that after 3 months she does need to come back off of this Discussed that often people have rebound weight gain after coming off of phentermine, and the importance of maintaining diet and exercise when she comes off of this medication We will follow-up in 3 months to see if she has been successful in weight loss

## 2017-11-12 NOTE — Assessment & Plan Note (Signed)
Well-controlled and asymptomatic Continue Flonase and Claritin as needed

## 2017-11-12 NOTE — Assessment & Plan Note (Signed)
Rare panic attacks As above for anxiety, okay to use Lorazepam as needed and a very sparing nature Prescription for Ativan #5 sent to the pharmacy Discussed that this is not for regular use Discussed role of therapy

## 2017-11-17 IMAGING — DX DG LUMBAR SPINE COMPLETE 4+V
6 series · 6 of 6 positions shown · non-contrast
Comparison: None.

CLINICAL DATA: Low back pain increased over the past 2 weeks, no
known injury, initial encounter

EXAM:
LUMBAR SPINE - COMPLETE 4+ VIEW

[l-spine ap]
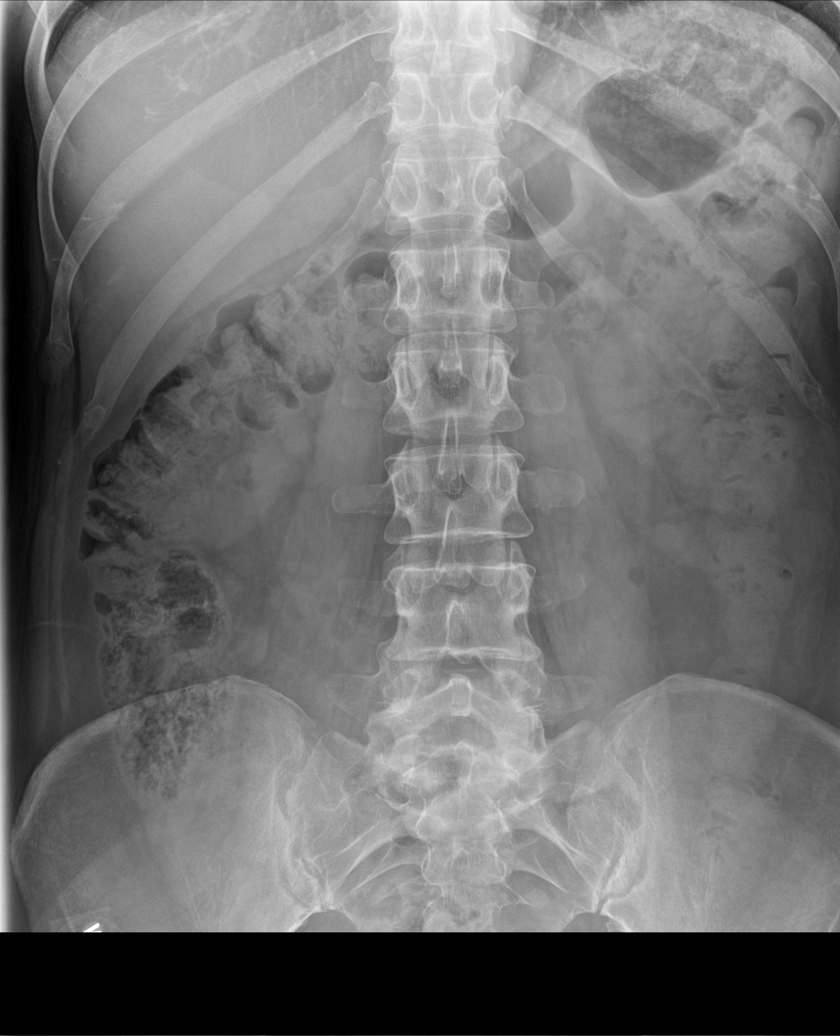

[l-spine obl (1 of 3)]
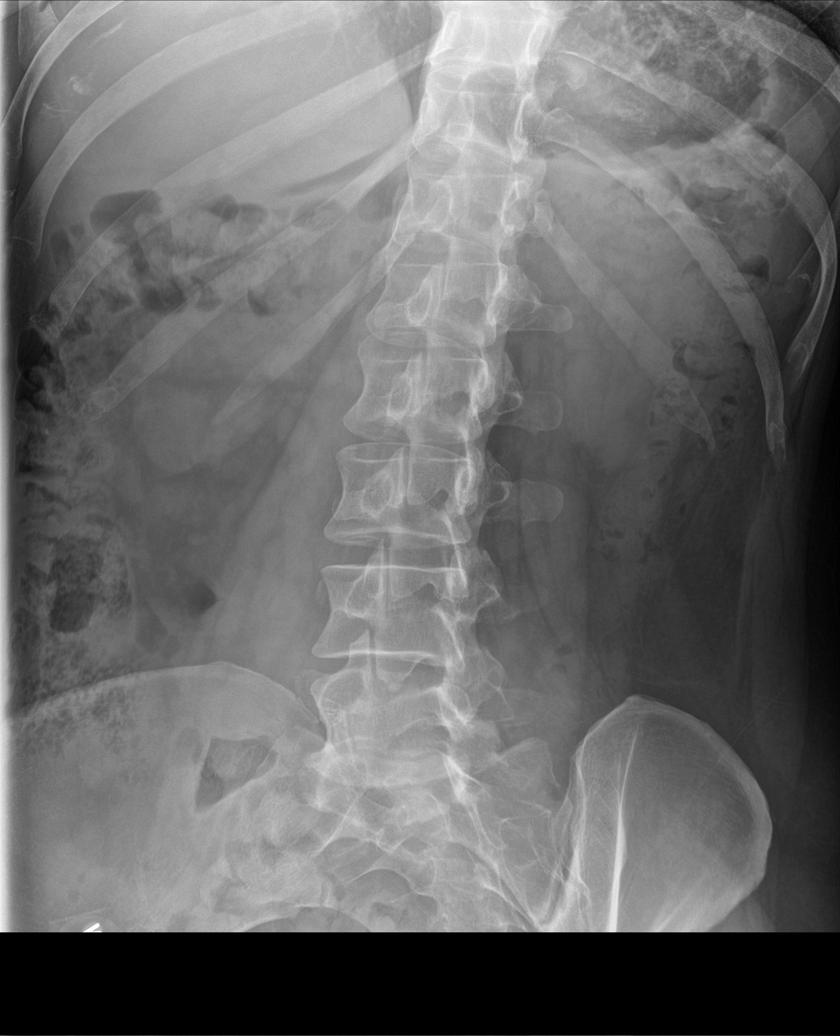

[l-spine obl (2 of 3)]
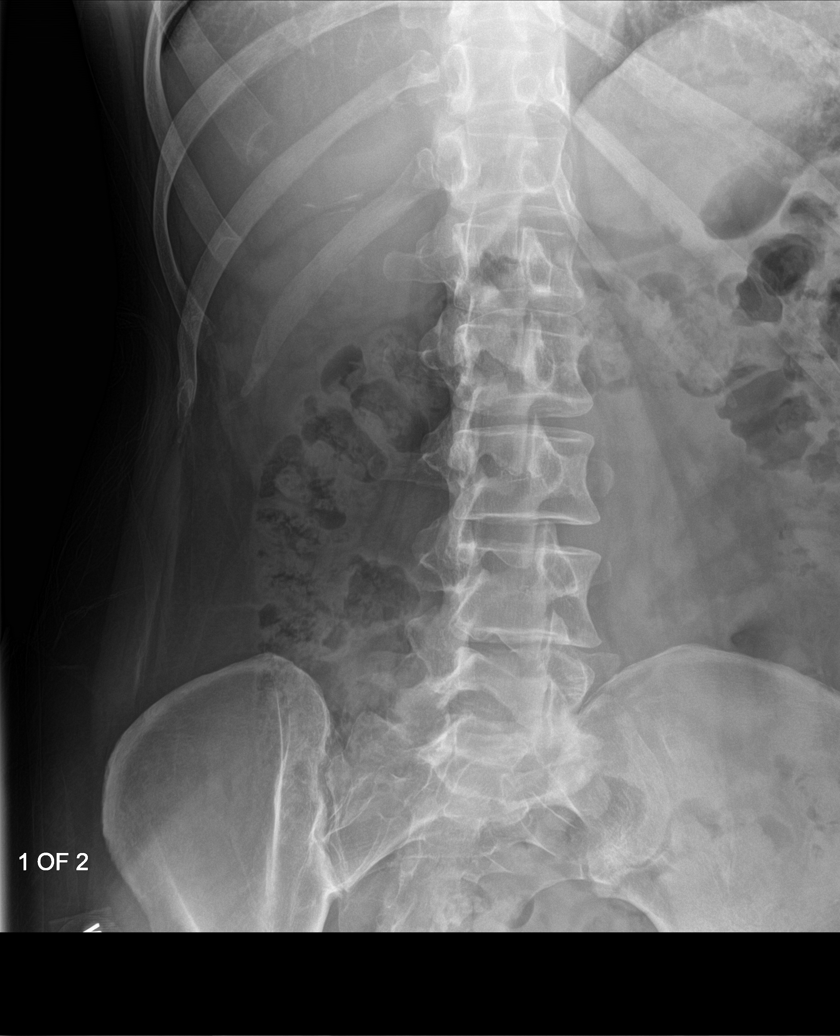

[l-spine lat]
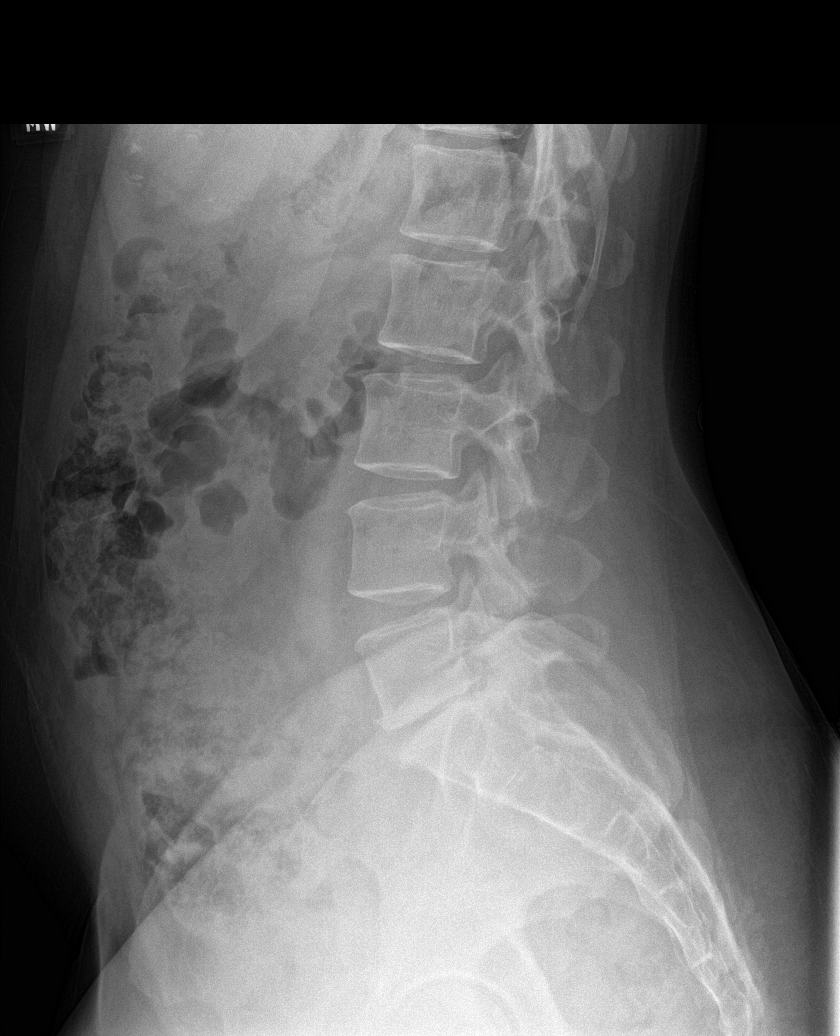

[l-spine spot]
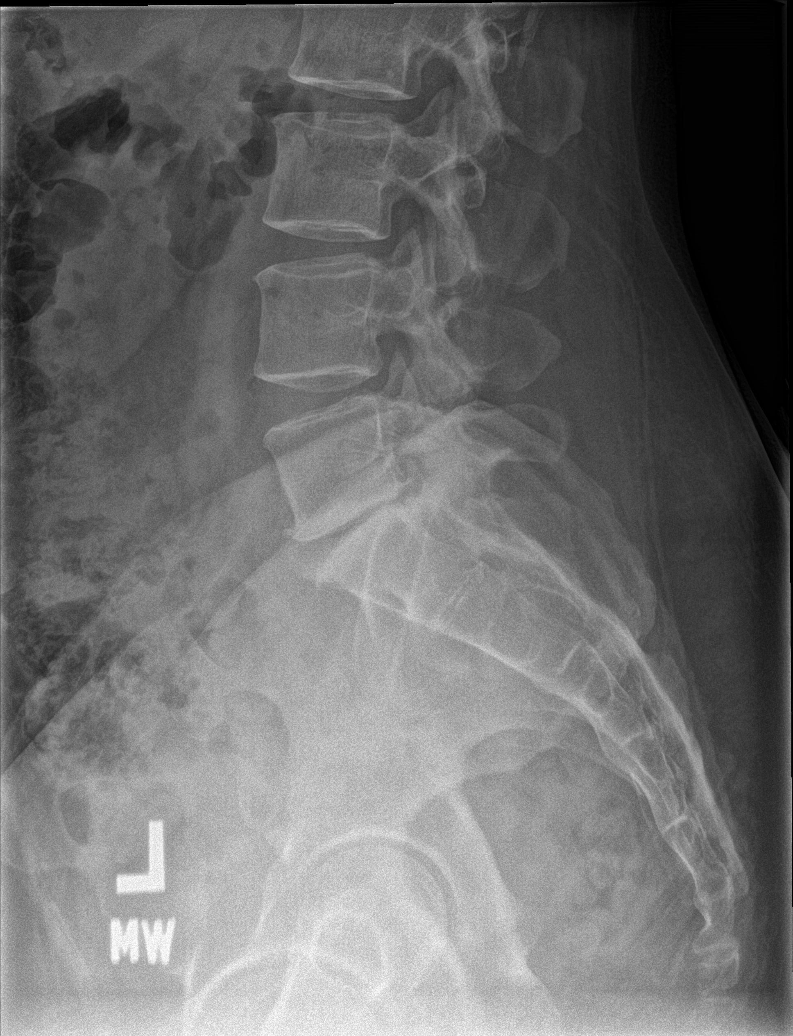

[l-spine obl (3 of 3)]
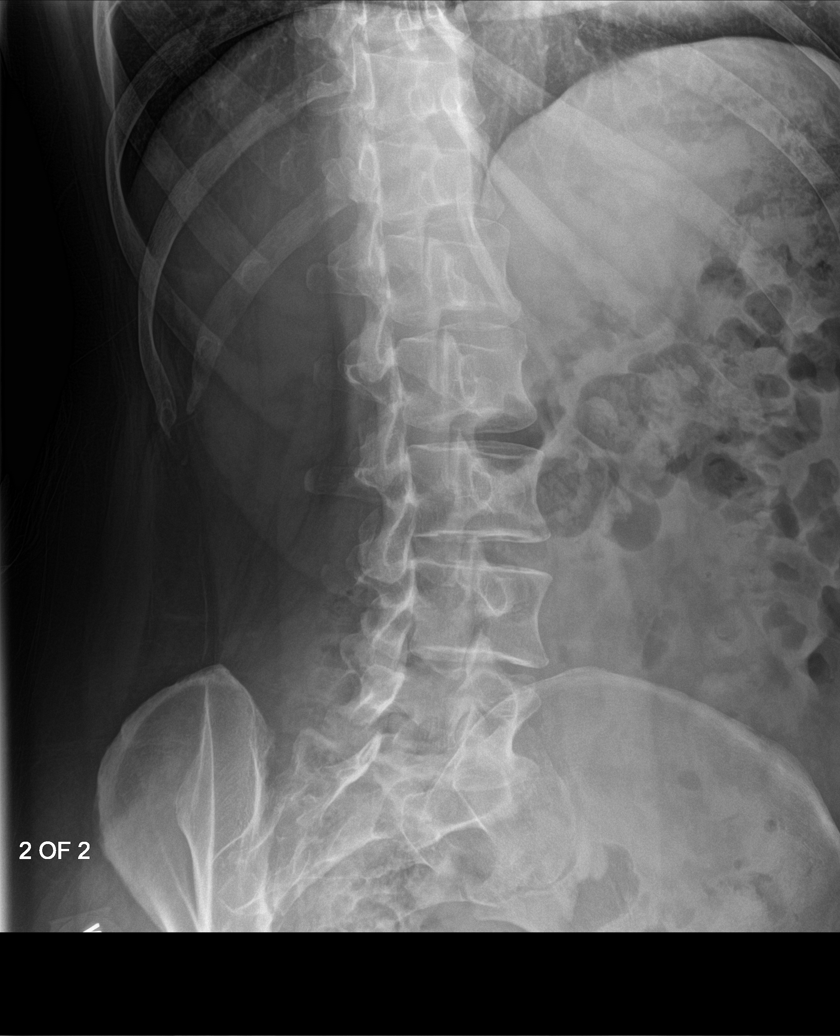

[6 of 6 positions shown; findings below may reference images not displayed]

FINDINGS: Five lumbar type vertebral bodies are well visualized. Vertebral
body height is well maintained. No pars defects are noted. No
anterolisthesis is seen. Disc space narrowing is noted at L4-5 and
to a greater degree at L5-S1.
IMPRESSION: Mild degenerative change without acute abnormality.

## 2018-01-08 DIAGNOSIS — L578 Other skin changes due to chronic exposure to nonionizing radiation: Secondary | ICD-10-CM | POA: Diagnosis not present

## 2018-01-08 DIAGNOSIS — L820 Inflamed seborrheic keratosis: Secondary | ICD-10-CM | POA: Diagnosis not present

## 2018-01-08 DIAGNOSIS — Z85828 Personal history of other malignant neoplasm of skin: Secondary | ICD-10-CM | POA: Diagnosis not present

## 2018-02-12 ENCOUNTER — Ambulatory Visit: Payer: 59 | Admitting: Family Medicine

## 2018-02-26 DIAGNOSIS — L820 Inflamed seborrheic keratosis: Secondary | ICD-10-CM | POA: Diagnosis not present

## 2018-02-26 DIAGNOSIS — Z85828 Personal history of other malignant neoplasm of skin: Secondary | ICD-10-CM | POA: Diagnosis not present

## 2018-02-26 DIAGNOSIS — L570 Actinic keratosis: Secondary | ICD-10-CM | POA: Diagnosis not present

## 2018-02-26 DIAGNOSIS — L821 Other seborrheic keratosis: Secondary | ICD-10-CM | POA: Diagnosis not present

## 2018-02-26 DIAGNOSIS — L578 Other skin changes due to chronic exposure to nonionizing radiation: Secondary | ICD-10-CM | POA: Diagnosis not present

## 2018-10-16 ENCOUNTER — Telehealth: Payer: Self-pay

## 2018-10-16 NOTE — Telephone Encounter (Signed)
Patient called to inquire about the PA on the Linzess and Bentyl

## 2018-10-16 NOTE — Telephone Encounter (Signed)
Unsure, patient hasn't been seen in a year nor do I see where this has been prescribed. Routing to PCP for her review, this is not an urgent matter. Also routing to nurse box to see if forms have come through in the office.

## 2018-10-16 NOTE — Telephone Encounter (Signed)
I have not seen these forms either.  Can discuss at upcoming visit

## 2018-10-16 NOTE — Telephone Encounter (Signed)
This message has been entered in error

## 2018-10-16 NOTE — Telephone Encounter (Signed)
Have not seen or received a PA for any of those medications.

## 2018-11-03 ENCOUNTER — Ambulatory Visit: Payer: Self-pay | Admitting: Family Medicine

## 2018-12-26 ENCOUNTER — Other Ambulatory Visit: Payer: Self-pay

## 2018-12-26 DIAGNOSIS — Z20822 Contact with and (suspected) exposure to covid-19: Secondary | ICD-10-CM

## 2018-12-27 LAB — NOVEL CORONAVIRUS, NAA: SARS-CoV-2, NAA: NOT DETECTED

## 2018-12-30 ENCOUNTER — Telehealth: Payer: Self-pay | Admitting: General Practice

## 2018-12-30 NOTE — Telephone Encounter (Signed)
Patient informed of negative covid-19 result. She verbalized understanding.

## 2018-12-31 ENCOUNTER — Encounter: Payer: Self-pay | Admitting: Physician Assistant

## 2018-12-31 ENCOUNTER — Ambulatory Visit (INDEPENDENT_AMBULATORY_CARE_PROVIDER_SITE_OTHER): Payer: Self-pay | Admitting: Physician Assistant

## 2018-12-31 DIAGNOSIS — J3089 Other allergic rhinitis: Secondary | ICD-10-CM

## 2018-12-31 DIAGNOSIS — J0140 Acute pansinusitis, unspecified: Secondary | ICD-10-CM

## 2018-12-31 MED ORDER — AMOXICILLIN-POT CLAVULANATE 875-125 MG PO TABS: 1.0000 | ORAL_TABLET | Freq: Two times a day (BID) | ORAL | 0 refills | Status: DC

## 2018-12-31 MED ORDER — FLUTICASONE PROPIONATE 50 MCG/ACT NA SUSP: NASAL | 3 refills | Status: DC

## 2018-12-31 NOTE — Progress Notes (Signed)
Patient: Kayla Alexander Female    DOB: [birth year]   48 y.o.   MRN: [id number] Visit Date: 12/31/2018  Today's Provider: Mar Daring, [name]   Chief Complaint  Patient presents with  . URI   Subjective:    I,[name],RMA am acting as a Education administrator for [employer], [name].  Virtual Visit via Telephone Note  I connected with Kayla Alexander on 12/31/18 at  1:20 PM EDT by telephone and verified that I am speaking with the correct person using two identifiers.  Location: Patient: Home Provider: [facility]   I discussed the limitations, risks, security and privacy concerns of performing an evaluation and management service by telephone and the availability of in person appointments. I also discussed with the patient that there may be a patient responsible charge related to this service. The patient expressed understanding and agreed to proceed.  URI  This is a new problem. The current episode started in the past 7 days. The problem has been gradually worsening. Maximum temperature: unknown because she does not have a thermometer but has felt warm. Associated symptoms include congestion, coughing, ear pain, a plugged ear sensation, rhinorrhea and sinus pain. Pertinent negatives include no chest pain, headaches, sneezing, sore throat or vomiting. Associated symptoms comments: Started feeling aching Monday afternoon. She has tried antihistamine for the symptoms. The treatment provided no relief.  She had covid 19 testing on 12/26/18 and was negative.  No Known Allergies   Current Outpatient Medications:  .  fluticasone (FLONASE) 50 MCG/ACT nasal spray, instill 2 sprays into each nostril once daily, Disp: 16 g, Rfl: 3 .  LORazepam (ATIVAN) 0.5 MG tablet, Take 1 tablet (0.5 mg total) by mouth daily as needed for anxiety., Disp: 5 tablet, Rfl: 0 .  amoxicillin-clavulanate (AUGMENTIN) 875-125 MG tablet, Take 1 tablet by mouth 2 (two) times daily., Disp: 20 tablet, Rfl: 0  Review of  Systems  Constitutional: Negative for fever.  HENT: Positive for congestion, ear pain, rhinorrhea, sinus pressure and sinus pain. Negative for sneezing and sore throat.   Respiratory: Positive for cough. Negative for chest tightness and shortness of breath.   Cardiovascular: Negative for chest pain, palpitations and leg swelling.  Gastrointestinal: Negative for vomiting.  Neurological: Negative for headaches.    Social History   Tobacco Use  . Smoking status: Current Every Day Smoker    Packs/day: 0.50    Years: 30.00    Pack years: 15.00    Types: Cigarettes  . Smokeless tobacco: Never Used  Substance Use Topics  . Alcohol use: Yes    Alcohol/week: 12.0 standard drinks    Types: 12 Cans of beer per week      Objective:   There were no vitals taken for this visit. There were no vitals filed for this visit.There is no height or weight on file to calculate BMI.   Physical Exam Constitutional:      General: She is not in acute distress. HENT:     Head:     Comments: Sounded congested and reports tenderness to palpation of maxillary sinuses, ear warmth and swollen lymph nodes around the ear Pulmonary:     Effort: Pulmonary effort is normal. No respiratory distress.  Neurological:     Mental Status: She is alert.      No results found for any visits on 12/31/18.     Assessment & Plan    1. Acute non-recurrent pansinusitis Worsening symptoms that have not responded to  OTC medications. Will give augmentin as below. Continue allergy medications. Stay well hydrated and get plenty of rest. Call if no symptom improvement or if symptoms worsen. - amoxicillin-clavulanate (AUGMENTIN) 875-125 MG tablet; Take 1 tablet by mouth 2 (two) times daily.  Dispense: 20 tablet; Refill: 0  2. Environmental and seasonal allergies Stable. Diagnosis pulled for medication refill. Continue current medical treatment plan. - fluticasone (FLONASE) 50 MCG/ACT nasal spray; instill 2 sprays into  each nostril once daily  Dispense: 16 g; Refill: 3   I discussed the assessment and treatment plan with the patient. The patient was provided an opportunity to ask questions and all were answered. The patient agreed with the plan and demonstrated an understanding of the instructions.   The patient was advised to call back or seek an in-person evaluation if the symptoms worsen or if the condition fails to improve as anticipated.  I provided 11 minutes of non-face-to-face time during this encounter.    Mar Daring, [name]  [facility]

## 2019-01-06 ENCOUNTER — Telehealth: Payer: Self-pay

## 2019-01-06 MED ORDER — AMOXICILLIN 875 MG PO TABS: 875.0000 mg | ORAL_TABLET | Freq: Two times a day (BID) | ORAL | 0 refills | Status: AC

## 2019-01-06 MED ORDER — FLUCONAZOLE 150 MG PO TABS: 150.0000 mg | ORAL_TABLET | Freq: Once | ORAL | 0 refills | Status: AC

## 2019-01-06 NOTE — Telephone Encounter (Signed)
Patient requesting antibiotic. She reports that she was treated with Augmentin but stopped after the second day because it was giving her a lot of itching and because of that feels like she now has a yeast infection.She states  Amoxicillin is what she needs nothing else (she still has symptoms)and something to treat the yeast infection please.  [facility]

## 2019-01-06 NOTE — Telephone Encounter (Signed)
She can change to amoxicillin, but this is the same active ingredient as augmentin.  If she is worried about allergy to augmentin, amoxicillin will be no different, but without rash or SOB, I think it is safe to take either.  Can take diflucan for yeast symptoms, but would wait until finishes abx.

## 2019-01-06 NOTE — Telephone Encounter (Signed)
Patient was advised and states that she will not take the Diflucan until after finishing her abx.

## 2019-03-17 ENCOUNTER — Encounter: Payer: Self-pay | Admitting: Family Medicine

## 2019-03-17 ENCOUNTER — Other Ambulatory Visit: Payer: Self-pay

## 2019-03-17 ENCOUNTER — Ambulatory Visit (INDEPENDENT_AMBULATORY_CARE_PROVIDER_SITE_OTHER): Payer: PRIVATE HEALTH INSURANCE | Admitting: Family Medicine

## 2019-03-17 VITALS — BP 118/81 | HR 69 | Temp 96.9°F | Resp 16 | Ht 66.0 in | Wt 172.0 lb

## 2019-03-17 DIAGNOSIS — E663 Overweight: Secondary | ICD-10-CM | POA: Diagnosis not present

## 2019-03-17 DIAGNOSIS — Z720 Tobacco use: Secondary | ICD-10-CM | POA: Diagnosis not present

## 2019-03-17 DIAGNOSIS — Z0000 Encounter for general adult medical examination without abnormal findings: Secondary | ICD-10-CM | POA: Diagnosis not present

## 2019-03-17 DIAGNOSIS — F1010 Alcohol abuse, uncomplicated: Secondary | ICD-10-CM | POA: Diagnosis not present

## 2019-03-17 NOTE — Patient Instructions (Signed)

## 2019-03-17 NOTE — Assessment & Plan Note (Signed)
3 to 5-minute discussion on the importance of smoking cessation, the health risks of continued smoking, and the benefits of cessation I have encouraged her previously to use resources like the [place] quit line We discussed harms of smoking, including increased risk of cancers, lung disease, heart disease, stroke Patient remains precontemplative and not ready to quit at this time

## 2019-03-17 NOTE — Assessment & Plan Note (Signed)
Discussed importance of healthy weight management Discussed diet and exercise Referral to nutritionist

## 2019-03-17 NOTE — Assessment & Plan Note (Signed)
Discussed that 4 drinks per day is considered excessive drinking Encouraged her to drink no more than 2 standard drinks per day Patient will try to cut back She admits that some of her drinking and smoking is driven by the excessive drinking and smoking of her husband Continue to reassess

## 2019-03-17 NOTE — Progress Notes (Signed)
Patient: Kayla Alexander, Female    DOB: [birth year], 48 y.o.   MRN: [id number] Visit Date: 03/17/2019  Today's Provider: Lavon Paganini, MD   Chief Complaint  Patient presents with  . Annual Exam   Subjective:     Annual physical exam Kayla Alexander is a 48 y.o. female who presents today for health maintenance and complete physical. She feels well. She reports exercising no. She reports she is sleeping well. 11/28/2016 Pap-normal  She would like to see a nutritionist to help her eat healthier.  She is drinking 3-4 Light beers each night.  She is smoking 0.5 PPD.   -----------------------------------------------------------------   Review of Systems  Constitutional: Negative.   HENT: Negative.   Eyes: Negative.   Respiratory: Negative.   Cardiovascular: Negative.   Gastrointestinal: Negative.   Endocrine: Negative.   Genitourinary: Negative.   Musculoskeletal: Negative.   Skin: Negative.   Allergic/Immunologic: Negative.   Neurological: Negative.   Hematological: Negative.   Psychiatric/Behavioral: Negative.     Social History      She  reports that she has been smoking cigarettes. She has a 15.00 pack-year smoking history. She has never used smokeless tobacco. She reports current alcohol use of about 12.0 standard drinks of alcohol per week. She reports that she does not use drugs.       Social History   Socioeconomic History  . Marital status: Divorced    Spouse name: Not on file  . Number of children: 2  . Years of education: Not on file  . Highest education level: Not on file  Occupational History  . Not on file  Tobacco Use  . Smoking status: Current Every Day Smoker    Packs/day: 0.50    Years: 30.00    Pack years: 15.00    Types: Cigarettes  . Smokeless tobacco: Never Used  Substance and Sexual Activity  . Alcohol use: Yes    Alcohol/week: 12.0 standard drinks    Types: 12 Cans of beer per week  . Drug use: No  . Sexual activity: Yes     Partners: Male    Birth control/protection: Surgical  Other Topics Concern  . Not on file  Social History Narrative  . Not on file   Social Determinants of Health   Financial Resource Strain:   . Difficulty of Paying Living Expenses: Not on file  Food Insecurity:   . Worried About Charity fundraiser in the Last Year: Not on file  . Ran Out of Food in the Last Year: Not on file  Transportation Needs:   . Lack of Transportation (Medical): Not on file  . Lack of Transportation (Non-Medical): Not on file  Physical Activity:   . Days of Exercise per Week: Not on file  . Minutes of Exercise per Session: Not on file  Stress:   . Feeling of Stress : Not on file  Social Connections:   . Frequency of Communication with Friends and Family: Not on file  . Frequency of Social Gatherings with Friends and Family: Not on file  . Attends Religious Services: Not on file  . Active Member of Clubs or Organizations: Not on file  . Attends Archivist Meetings: Not on file  . Marital Status: Not on file    Past Medical History:  Diagnosis Date  . Allergy   . Anxiety   . Arthritis   . DDD (degenerative disc disease), lumbar   . Depression   .  GERD (gastroesophageal reflux disease)   . Gravida 2 para 2      Patient Active Problem List   Diagnosis Date Noted  . Overweight 11/12/2017  . Panic attack 11/11/2017  . Environmental and seasonal allergies 02/29/2016  . Chronic low back pain 02/14/2015  . IBS (irritable bowel syndrome) 02/14/2015  . Anxiety and depression 02/14/2015  . Eczema 02/14/2015  . Tobacco abuse 02/14/2015  . DDD (degenerative disc disease), lumbar 06/26/2013    Past Surgical History:  Procedure Laterality Date  . ENDOMETRIAL ABLATION Bilateral 2011    Family History        Family Status  Relation Name Status  . Sister  Alive  . Mother  Alive  . PGM  (Not Specified)  . Father  Alive  . Brother  Deceased  . Mat Aunt  (Not Specified)  . Annamarie Major   (Not Specified)  . PGF  (Not Specified)        Her family history includes Anxiety disorder in her paternal grandmother and paternal uncle; Cancer in her brother and paternal grandmother; Depression in her maternal aunt and sister; Diabetes in her mother and paternal grandfather; Healthy in her father; Heart disease in her paternal grandmother; Hypertension in her mother; Panic disorder in her paternal grandmother and paternal uncle; Stroke (age of onset: 17) in her mother.      No Known Allergies   Current Outpatient Medications:  .  fluticasone (FLONASE) 50 MCG/ACT nasal spray, instill 2 sprays into each nostril once daily, Disp: 16 g, Rfl: 3 .  LORazepam (ATIVAN) 0.5 MG tablet, Take 1 tablet (0.5 mg total) by mouth daily as needed for anxiety., Disp: 5 tablet, Rfl: 0   Patient Care Team: Virginia Crews, MD as PCP - General (Family Medicine)    Objective:    Vitals: BP 118/81 (BP Location: Right Arm, Patient Position: Sitting, Cuff Size: Large)   Pulse 69   Temp (!) 96.9 F (36.1 C) (Temporal)   Resp 16   Ht 5\' 6"  (1.676 m)   Wt 172 lb (78 kg)   BMI 27.76 kg/m    Vitals:   03/17/19 0926  BP: 118/81  Pulse: 69  Resp: 16  Temp: (!) 96.9 F (36.1 C)  TempSrc: Temporal  Weight: 172 lb (78 kg)  Height: 5\' 6"  (1.676 m)     Physical Exam Vitals reviewed.  Constitutional:      General: She is not in acute distress.    Appearance: Normal appearance. She is well-developed. She is not diaphoretic.  HENT:     Head: Normocephalic and atraumatic.     Right Ear: Tympanic membrane, ear canal and external ear normal.     Left Ear: Tympanic membrane, ear canal and external ear normal.  Eyes:     General: No scleral icterus.    Conjunctiva/sclera: Conjunctivae normal.     Pupils: Pupils are equal, round, and reactive to light.  Neck:     Thyroid: No thyromegaly.  Cardiovascular:     Rate and Rhythm: Normal rate and regular rhythm.     Pulses: Normal pulses.     Heart  sounds: Normal heart sounds. No murmur.  Pulmonary:     Effort: Pulmonary effort is normal. No respiratory distress.     Breath sounds: Normal breath sounds. No wheezing or rales.  Abdominal:     General: There is no distension.     Palpations: Abdomen is soft.     Tenderness: There is no abdominal  tenderness.  Musculoskeletal:        General: No deformity.     Cervical back: Neck supple.     Right lower leg: No edema.     Left lower leg: No edema.  Lymphadenopathy:     Cervical: No cervical adenopathy.  Skin:    General: Skin is warm and dry.     Capillary Refill: Capillary refill takes less than 2 seconds.     Findings: No rash.  Neurological:     Mental Status: She is alert and oriented to person, place, and time. Mental status is at baseline.  Psychiatric:        Mood and Affect: Mood normal.        Behavior: Behavior normal.        Thought Content: Thought content normal.      Depression Screen PHQ 2/9 Scores 03/17/2019 11/11/2017 01/09/2017 02/29/2016  PHQ - 2 Score 0 0 0 0  PHQ- 9 Score 0 3 0 -       Assessment & Plan:     Routine Health Maintenance and Physical Exam  Exercise Activities and Dietary recommendations Goals   None      There is no immunization history on file for this patient.  Health Maintenance  Topic Date Due  . INFLUENZA VACCINE  06/24/2019 (Originally 10/25/2018)  . PAP SMEAR-Modifier  11/29/2019  . TETANUS/TDAP  01/29/2026  . HIV Screening  Completed     Discussed health benefits of physical activity, and encouraged her to engage in regular exercise appropriate for her age and condition.    --------------------------------------------------------------------  Problem List Items Addressed This Visit      Other   Tobacco abuse    3 to 5-minute discussion on the importance of smoking cessation, the health risks of continued smoking, and the benefits of cessation I have encouraged her previously to use resources like the [place] quit  line We discussed harms of smoking, including increased risk of cancers, lung disease, heart disease, stroke Patient remains precontemplative and not ready to quit at this time      Overweight    Discussed importance of healthy weight management Discussed diet and exercise Referral to nutritionist      Relevant Orders   Amb ref to Medical Nutrition Therapy-MNT   Lipid panel   CMP (Comprehensive metabolic panel)   CBC   Alcohol use disorder, mild, abuse    Discussed that 4 drinks per day is considered excessive drinking Encouraged her to drink no more than 2 standard drinks per day Patient will try to cut back She admits that some of her drinking and smoking is driven by the excessive drinking and smoking of her husband Continue to reassess       Other Visit Diagnoses    Encounter for annual physical exam    -  Primary   Relevant Orders   Lipid panel   CMP (Comprehensive metabolic panel)   CBC       Return in about 1 year (around 03/16/2020) for CPE.   The entirety of the information documented in the History of Present Illness, Review of Systems and Physical Exam were personally obtained by me. Portions of this information were initially documented by Lynford Humphrey, CMA and reviewed by me for thoroughness and accuracy.    Cain Fitzhenry, Dionne Bucy, MD MPH [facility]

## 2019-03-18 LAB — LIPID PANEL
Chol/HDL Ratio: 4 ratio (ref 0.0–4.4)
Cholesterol, Total: 206 mg/dL — ABNORMAL HIGH (ref 100–199)
HDL: 52 mg/dL (ref >39)
LDL Chol Calc (NIH): 127 mg/dL — ABNORMAL HIGH (ref 0–99)
Triglycerides: 150 mg/dL — ABNORMAL HIGH (ref 0–149)
VLDL Cholesterol Cal: 27 mg/dL (ref 5–40)

## 2019-03-18 LAB — CBC
Hematocrit: 40.5 % (ref 34.0–46.6)
Hemoglobin: 14.2 g/dL (ref 11.1–15.9)
MCH: 33.1 pg — ABNORMAL HIGH (ref 26.6–33.0)
MCHC: 35.1 g/dL (ref 31.5–35.7)
MCV: 94 fL (ref 79–97)
Platelets: 246 10*3/uL (ref 150–450)
RBC: 4.29 x10E6/uL (ref 3.77–5.28)
RDW: 12.2 % (ref 11.7–15.4)
WBC: 5.5 10*3/uL (ref 3.4–10.8)

## 2019-03-18 LAB — COMPREHENSIVE METABOLIC PANEL
ALT: 10 IU/L (ref 0–32)
AST: 18 IU/L (ref 0–40)
Albumin/Globulin Ratio: 2 (ref 1.2–2.2)
Albumin: 4.2 g/dL (ref 3.8–4.8)
Alkaline Phosphatase: 75 IU/L (ref 39–117)
BUN/Creatinine Ratio: 10 (ref 9–23)
BUN: 8 mg/dL (ref 6–24)
Bilirubin Total: 0.5 mg/dL (ref 0.0–1.2)
CO2: 18 mmol/L — ABNORMAL LOW (ref 20–29)
Calcium: 8.7 mg/dL (ref 8.7–10.2)
Chloride: 108 mmol/L — ABNORMAL HIGH (ref 96–106)
Creatinine, Ser: 0.78 mg/dL (ref 0.57–1.00)
GFR calc Af Amer: 104 mL/min/{1.73_m2} (ref >59)
GFR calc non Af Amer: 90 mL/min/{1.73_m2} (ref >59)
Globulin, Total: 2.1 g/dL (ref 1.5–4.5)
Glucose: 93 mg/dL (ref 65–99)
Potassium: 4.4 mmol/L (ref 3.5–5.2)
Sodium: 141 mmol/L (ref 134–144)
Total Protein: 6.3 g/dL (ref 6.0–8.5)

## 2019-03-25 ENCOUNTER — Telehealth: Payer: Self-pay

## 2019-03-25 NOTE — Telephone Encounter (Signed)
The [facility] is slightly higher because she smokes. Rest of blood count is normal. This is also the case with her CO2 being lower. The chloride is stable compared to last year. Her Kidney and liver enzymes are normal. Sodium, potassium and calcium are normal. Her sugar is normal. Cholesterol is borderline elevated. Currently her risk of having a cardiovascular event is low at only a 3.1% chance over the next 10 years. Working on lifestyle modifications with limiting fatty foods, processed meats, red meats and sugars will help. Also increasing physical activity to get in 150 min of moderate activity per week will help also.

## 2019-03-25 NOTE — Telephone Encounter (Signed)
This is Dr. Sharmaine Base patient. She would like the results of her recent labs drawn on 03/17/2019.

## 2019-03-25 NOTE — Telephone Encounter (Signed)
Copied from [place] [id number]. Topic: General - Other >> Mar 25, 2019  3:50 PM Alanda Slim E wrote: Reason for CRM: t would like to go over her lab results/ Pt is concern about the results/ please advise

## 2019-03-26 NOTE — Telephone Encounter (Signed)
Patient advised as below. Patient verbalizes understanding and is in agreement with treatment plan.  

## 2019-04-22 ENCOUNTER — Ambulatory Visit: Payer: Self-pay | Admitting: Skilled Nursing Facility1

## 2019-05-20 ENCOUNTER — Ambulatory Visit: Payer: No Typology Code available for payment source | Attending: Internal Medicine

## 2019-05-20 DIAGNOSIS — Z20822 Contact with and (suspected) exposure to covid-19: Secondary | ICD-10-CM

## 2019-05-21 LAB — NOVEL CORONAVIRUS, NAA: SARS-CoV-2, NAA: NOT DETECTED

## 2019-06-11 ENCOUNTER — Telehealth: Payer: Self-pay | Admitting: Family Medicine

## 2019-06-11 DIAGNOSIS — E663 Overweight: Secondary | ICD-10-CM

## 2019-06-11 DIAGNOSIS — R232 Flushing: Secondary | ICD-10-CM

## 2019-06-11 NOTE — Telephone Encounter (Signed)
Pt forgot to discuss in most recent visit with Dr. B. Regarding her hot flashes and prespiration her forehead that comes from no where.  She can experience them day or night.  Pt is asking if her recent labs would show anything as to why this has returned.  Please call pt back to discuss at [phone].  Thanks, [employer]

## 2019-06-11 NOTE — Telephone Encounter (Signed)
Patient advised as below.  Patient verbalizes understanding and is in agreement with treatment plan. Patient reports she has not had menstrual periods for 10 years.

## 2019-06-11 NOTE — Telephone Encounter (Signed)
Labs showed no cause for hot flashes.  We did not check her thyroid, however.  I would recommend her coming to have a TSH checked.  It is also possible that she is perimenopausal and that is why she is experiencing hot flashes.  Does she still have menstrual periods?  If so, we will use those as a guide on when she is getting close to menopause, but if not, I recommend checking FSH/LH.

## 2019-06-13 LAB — FOLLICLE STIMULATING HORMONE: FSH: 88.8 m[IU]/mL

## 2019-06-13 LAB — LUTEINIZING HORMONE: LH: 53.6 m[IU]/mL

## 2019-06-13 LAB — TSH: TSH: 0.769 u[IU]/mL (ref 0.450–4.500)

## 2019-06-15 ENCOUNTER — Telehealth: Payer: Self-pay

## 2019-06-15 NOTE — Telephone Encounter (Signed)
[employer]   Result Notes and Comments to Patient Comment seen by patient Kayla Alexander on 06/15/2019 8:56 AM EDT

## 2019-06-15 NOTE — Telephone Encounter (Signed)
-----   Message from Virginia Crews, MD sent at 06/15/2019  8:55 AM EDT ----- Normal thyroid function.  Patient is post-menopausal.  This is likely the cause of the hot flashes.

## 2019-08-07 ENCOUNTER — Other Ambulatory Visit: Payer: Self-pay | Admitting: Family Medicine

## 2019-08-07 ENCOUNTER — Telehealth (INDEPENDENT_AMBULATORY_CARE_PROVIDER_SITE_OTHER): Payer: PRIVATE HEALTH INSURANCE | Admitting: Adult Health

## 2019-08-07 ENCOUNTER — Encounter: Payer: Self-pay | Admitting: Adult Health

## 2019-08-07 DIAGNOSIS — H6691 Otitis media, unspecified, right ear: Secondary | ICD-10-CM | POA: Diagnosis not present

## 2019-08-07 DIAGNOSIS — J3089 Other allergic rhinitis: Secondary | ICD-10-CM | POA: Diagnosis not present

## 2019-08-07 DIAGNOSIS — H9201 Otalgia, right ear: Secondary | ICD-10-CM | POA: Diagnosis not present

## 2019-08-07 MED ORDER — AMOXICILLIN 875 MG PO TABS: 875.0000 mg | ORAL_TABLET | Freq: Two times a day (BID) | ORAL | 0 refills | Status: DC

## 2019-08-07 NOTE — Patient Instructions (Signed)
Allergic Rhinitis, Adult Allergic rhinitis is a reaction to allergens in the air. Allergens are tiny specks (particles) in the air that cause your body to have an allergic reaction. This condition cannot be passed from person to person (is not contagious). Allergic rhinitis cannot be cured, but it can be controlled. There are two types of allergic rhinitis:  Seasonal. This type is also called hay fever. It happens only during certain times of the year.  Perennial. This type can happen at any time of the year. What are the causes? This condition may be caused by:  Pollen from grasses, trees, and weeds.  House dust mites.  Pet dander.  Mold. What are the signs or symptoms? Symptoms of this condition include:  Sneezing.  Runny or stuffy nose (nasal congestion).  A lot of mucus in the back of the throat (postnasal drip).  Itchy nose.  Tearing of the eyes.  Trouble sleeping.  Being sleepy during day. How is this treated? There is no cure for this condition. You should avoid things that trigger your symptoms (allergens). Treatment can help to relieve symptoms. This may include:  Medicines that block allergy symptoms, such as antihistamines. These may be given as a shot, nasal spray, or pill.  Shots that are given until your body becomes less sensitive to the allergen (desensitization).  Stronger medicines, if all other treatments have not worked. Follow these instructions at home: Avoiding allergens   Find out what you are allergic to. Common allergens include smoke, dust, and pollen.  Avoid them if you can. These are some of the things that you can do to avoid allergens: ? Replace carpet with wood, tile, or vinyl flooring. Carpet can trap dander and dust. ? Clean any mold found in the home. ? Do not smoke. Do not allow smoking in your home. ? Change your heating and air conditioning filter at least once a month. ? During allergy season:  Keep windows closed as much as  you can. If possible, use air conditioning when there is a lot of pollen in the air.  Use a special filter for allergies with your furnace and air conditioner.  Plan outdoor activities when pollen counts are lowest. This is usually during the early morning or evening hours.  If you do go outdoors when pollen count is high, wear a special mask for people with allergies.  When you come indoors, take a shower and change your clothes before sitting on furniture or bedding. General instructions  Do not use fans in your home.  Do not hang clothes outside to dry.  Wear sunglasses to keep pollen out of your eyes.  Wash your hands right away after you touch household pets.  Take over-the-counter and prescription medicines only as told by your doctor.  Keep all follow-up visits as told by your doctor. This is important. Contact a doctor if:  You have a fever.  You have a cough that does not go away (is persistent).  You start to make whistling sounds when you breathe (wheeze).  Your symptoms do not get better with treatment.  You have thick fluid coming from your nose.  You start to have nosebleeds. Get help right away if:  Your tongue or your lips are swollen.  You have trouble breathing.  You feel dizzy or you feel like you are going to pass out (faint).  You have cold sweats. Summary  Allergic rhinitis is a reaction to allergens in the air.  This condition may be   caused by allergens. These include pollen, dust mites, pet dander, and mold.  Symptoms include a runny, itchy nose, sneezing, or tearing eyes. You may also have trouble sleeping or feel sleepy during the day.  Treatment includes taking medicines and avoiding allergens. You may also get shots or take stronger medicines.  Get help if you have a fever or a cough that does not stop. Get help right away if you are short of breath. This information is not intended to replace advice given to you by your health care  provider. Make sure you discuss any questions you have with your health care provider. Document Revised: 07/01/2018 Document Reviewed: 10/01/2017 Elsevier Patient Education  [place], Adult An earache, or ear pain, can be caused by many things, including:  An infection.  Ear wax buildup.  Ear pressure.  Something in the ear that should not be there (foreign body).  A sore throat.  Tooth problems.  Jaw problems. Treatment of the earache will depend on the cause. If the cause is not clear or cannot be determined, you may need to watch your symptoms until your earache goes away or until a cause is found. Follow these instructions at home: Medicines  Take or apply over-the-counter and prescription medicines only as told by your health care provider.  If you were prescribed an antibiotic medicine, use it as told by your health care provider. Do not stop using the antibiotic even if you start to feel better.  Do not put anything in your ear other than medicine that is prescribed by your health care provider. Managing pain If directed, apply heat to the affected area as often as told by your health care provider. Use the heat source that your health care provider recommends, such as a moist heat pack or a heating pad.  Place a towel between your skin and the heat source.  Leave the heat on for 20-30 minutes.  Remove the heat if your skin turns bright red. This is especially important if you are unable to feel pain, heat, or cold. You may have a greater risk of getting burned. If directed, put ice on the affected area as often as told by your health care provider. To do this:      Put ice in a plastic bag.  Place a towel between your skin and the bag.  Leave the ice on for 20 minutes, 2-3 times a day. General instructions  Pay attention to any changes in your symptoms.  Try resting in an upright position instead of lying down. This may help to reduce  pressure in your ear and relieve pain.  Chew gum if it helps to relieve your ear pain.  Treat any allergies as told by your health care provider.  Drink enough fluid to keep your urine pale yellow.  It is up to you to get the results of any tests that were done. Ask your health care provider, or the department that is doing the tests, when your results will be ready.  Keep all follow-up visits as told by your health care provider. This is important. Contact a health care provider if:  Your pain does not improve within 2 days.  Your earache gets worse.  You have new symptoms.  You have a fever. Get help right away if you:  Have a severe headache.  Have a stiff neck.  Have trouble swallowing.  Have redness or swelling behind your ear.  Have fluid or blood coming from  your ear.  Have hearing loss.  Feel dizzy. Summary  An earache, or ear pain, can be caused by many things.  Treatment of the earache will depend on the cause. Follow recommendations from your health care provider to treat your ear pain.  If the cause is not clear or cannot be determined, you may need to watch your symptoms until your earache goes away or until a cause is found.  Keep all follow-up visits as told by your health care provider. This is important. This information is not intended to replace advice given to you by your health care provider. Make sure you discuss any questions you have with your health care provider. Document Revised: 10/18/2018 Document Reviewed: 10/18/2018 Elsevier Patient Education  [place]. Otitis Media, Adult  Otitis media means that the middle ear is red and swollen (inflamed) and full of fluid. The condition usually goes away on its own. Follow these instructions at home:  Take over-the-counter and prescription medicines only as told by your doctor.  If you were prescribed an antibiotic medicine, take it as told by your doctor. Do not stop taking the  antibiotic even if you start to feel better.  Keep all follow-up visits as told by your doctor. This is important. Contact a doctor if:  You have bleeding from your nose.  There is a lump on your neck.  You are not getting better in 5 days.  You feel worse instead of better. Get help right away if:  You have pain that is not helped with medicine.  You have swelling, redness, or pain around your ear.  You get a stiff neck.  You cannot move part of your face (paralyzed).  You notice that the bone behind your ear hurts when you touch it.  You get a very bad headache. Summary  Otitis media means that the middle ear is red, swollen, and full of fluid.  This condition usually goes away on its own. In some cases, treatment may be needed.  If you were prescribed an antibiotic medicine, take it as told by your doctor. This information is not intended to replace advice given to you by your health care provider. Make sure you discuss any questions you have with your health care provider. Document Revised: 02/22/2017 Document Reviewed: 04/02/2016 Elsevier Patient Education  2020 [employer].

## 2019-08-07 NOTE — Telephone Encounter (Signed)
Call to patient- advised she will probably not get antibiotic called in - advised she will need appointment- patient scheduled virtual visit. Complaints: headache, sinus pressure, ear pain, sore throat.

## 2019-08-07 NOTE — Progress Notes (Signed)
Established patient visit   Patient: Kayla Alexander   DOB: Nov 04, 1970   49 y.o. Female  MRN: [id number] Visit Date: 08/07/2019  Today's healthcare provider: Marcille Buffy, FNP   No chief complaint on file.  Subjective    Sinus Problem This is a new problem. The current episode started in the past 7 days. The problem has been gradually worsening since onset. There has been no fever. Associated symptoms include ear pain (right side described as sharp), headaches, sinus pressure, a sore throat (right side) and swollen glands (right). Pertinent negatives include no chills, congestion, coughing, diaphoresis, hoarse voice, neck pain, shortness of breath or sneezing. Treatments tried: Sinus tablets? and Flonase. The treatment provided no relief.   Frontal sinus pressure. Clear nasal discharge.  She reports she has had multiple ear infections in the past and she feels she has one now.  Pain in ear 7/10.denies drainage or trauma to ear. Denies any swimming.   She has been using her Flonase.  She takes daily second generation antihistamine [employer]  brand.  Denies any ill exposures. Denies loss of taste or smell.   Patient  denies any fever, body aches,chills, rash, chest pain, shortness of breath, nausea, vomiting, or diarrhea.  Denies dizziness, lightheadedness, pre syncopal or syncopal episodes.      Patient Active Problem List   Diagnosis Date Noted  . Alcohol use disorder, mild, abuse 03/17/2019  . Overweight 11/12/2017  . Panic attack 11/11/2017  . Environmental and seasonal allergies 02/29/2016  . Chronic low back pain 02/14/2015  . IBS (irritable bowel syndrome) 02/14/2015  . Anxiety and depression 02/14/2015  . Eczema 02/14/2015  . Tobacco abuse 02/14/2015  . DDD (degenerative disc disease), lumbar 06/26/2013   Past Medical History:  Diagnosis Date  . Allergy   . Anxiety   . Arthritis   . DDD (degenerative disc disease), lumbar   . Depression   . GERD  (gastroesophageal reflux disease)   . Gravida 2 para 2    Past Surgical History:  Procedure Laterality Date  . ENDOMETRIAL ABLATION Bilateral 2011   Social History   Tobacco Use  . Smoking status: Current Every Day Smoker    Packs/day: 0.50    Years: 30.00    Pack years: 15.00    Types: Cigarettes  . Smokeless tobacco: Never Used  Substance Use Topics  . Alcohol use: Yes    Alcohol/week: 12.0 standard drinks    Types: 12 Cans of beer per week  . Drug use: No   Social History   Socioeconomic History  . Marital status: Divorced    Spouse name: Not on file  . Number of children: 2  . Years of education: Not on file  . Highest education level: Not on file  Occupational History  . Not on file  Tobacco Use  . Smoking status: Current Every Day Smoker    Packs/day: 0.50    Years: 30.00    Pack years: 15.00    Types: Cigarettes  . Smokeless tobacco: Never Used  Substance and Sexual Activity  . Alcohol use: Yes    Alcohol/week: 12.0 standard drinks    Types: 12 Cans of beer per week  . Drug use: No  . Sexual activity: Yes    Partners: Male    Birth control/protection: Surgical  Other Topics Concern  . Not on file  Social History Narrative  . Not on file   Social Determinants of Health   Financial Resource Strain:   .  Difficulty of Paying Living Expenses:   Food Insecurity:   . Worried About Charity fundraiser in the Last Year:   . Arboriculturist in the Last Year:   Transportation Needs:   . Film/video editor (Medical):   Marland Kitchen Lack of Transportation (Non-Medical):   Physical Activity:   . Days of Exercise per Week:   . Minutes of Exercise per Session:   Stress:   . Feeling of Stress :   Social Connections:   . Frequency of Communication with Friends and Family:   . Frequency of Social Gatherings with Friends and Family:   . Attends Religious Services:   . Active Member of Clubs or Organizations:   . Attends Archivist Meetings:   Marland Kitchen Marital  Status:   Intimate Partner Violence:   . Fear of Current or Ex-Partner:   . Emotionally Abused:   Marland Kitchen Physically Abused:   . Sexually Abused:    Family Status  Relation Name Status  . Sister  Alive  . Mother  Alive  . PGM  (Not Specified)  . Father  Alive  . Brother  Deceased  . Mat Aunt  (Not Specified)  . Annamarie Major  (Not Specified)  . PGF  (Not Specified)   Family History  Problem Relation Age of Onset  . Depression Sister   . Stroke Mother 40       un-controlled diabetes  . Diabetes Mother   . Hypertension Mother   . Heart disease Paternal Grandmother   . Anxiety disorder Paternal Grandmother   . Cancer Paternal Grandmother   . Panic disorder Paternal Grandmother   . Healthy Father   . Cancer Brother        esophageal  . Depression Maternal Aunt   . Anxiety disorder Paternal Uncle   . Panic disorder Paternal Uncle   . Diabetes Paternal Grandfather    No Known Allergies     Medications: Outpatient Medications Prior to Visit  Medication Sig  . fluticasone (FLONASE) 50 MCG/ACT nasal spray instill 2 sprays into each nostril once daily   No facility-administered medications prior to visit.    Review of Systems  Constitutional: Negative for chills and diaphoresis.  HENT: Positive for ear pain (right side described as sharp), sinus pressure and sore throat (right side). Negative for congestion, hoarse voice and sneezing.   Respiratory: Negative for cough and shortness of breath.   Musculoskeletal: Negative for neck pain.  Neurological: Positive for headaches.    Last CBC Lab Results  Component Value Date   WBC 5.5 03/17/2019   HGB 14.2 03/17/2019   HCT 40.5 03/17/2019   MCV 94 03/17/2019   MCH 33.1 (H) 03/17/2019   RDW 12.2 03/17/2019   PLT 246 [id number]   Last metabolic panel Lab Results  Component Value Date   GLUCOSE 93 03/17/2019   NA 141 03/17/2019   K 4.4 03/17/2019   CL 108 (H) 03/17/2019   CO2 18 (L) 03/17/2019   BUN 8 03/17/2019    CREATININE 0.78 03/17/2019   GFRNONAA 90 03/17/2019   GFRAA 104 03/17/2019   CALCIUM 8.7 03/17/2019   PROT 6.3 03/17/2019   ALBUMIN 4.2 03/17/2019   LABGLOB 2.1 03/17/2019   AGRATIO 2.0 03/17/2019   BILITOT 0.5 03/17/2019   ALKPHOS 75 03/17/2019   AST 18 03/17/2019   ALT 10 03/17/2019   ANIONGAP 8 11/03/2017      Objective    There were no vitals taken for this  visit.  No vital signs available telephone visit only. Video on My chart would not connect.  BP Readings from Last 3 Encounters:  03/17/19 118/81  11/11/17 120/76  11/03/17 121/83      Physical Exam   Patient is alert and oriented and responsive to questions Engages in conversation with provider. Speaks in full sentences without any pauses without any shortness of breath or distress.    No results found for any visits on 08/07/19.  Assessment & Plan     Right otitis media, unspecified otitis media type  Ear pain, referred, right  Environmental and seasonal allergies  Advised Augmentin for above due to her history with sinusitis, she declined and prefers Amoxicillin. Discussed risks versus benefits and possible resistant organism. Patient verbalized understanding and still prefers Amoxicillin.  Ibuprofen 600 mg every 8 hours for ear pain. Discussed red Flags. Seek in person care immediately if worsening symptoms at anytime.   Discussed guidelines and timeframe for sinusitis and treatment. Will treat ear as above, dicussed fluid and pressure in ears as well and continued use of daily antihistamine second generation as well as Flonase per package instructions. Given reported history of otitis media will treat.    Return in about 5 days (around 08/12/2019), or if symptoms worsen or fail to improve, for at any time for any worsening symptoms, Go to Emergency room/ urgent care if worse. Advised patient call the office or your primary care doctor for an appointment if no improvement within 72 hours or if any symptoms  change or worsen at any time  Advised ER or urgent Care if after hours or on weekend. Call 911 for emergency symptoms at any time.Patinet verbalized understanding of all instructions given/reviewed and treatment plan and has no further questions or concerns at this time.         IWellington Hampshire Allis Quirarte, FNP, have reviewed all documentation for this visit. The documentation on 08/07/19 for the exam, diagnosis, procedures, and orders are all accurate and complete.   Marcille Buffy, [place] [phone] (phone) [phone] (fax)  [place]

## 2019-08-07 NOTE — Telephone Encounter (Signed)
Medication Refill - Medication:amoxicillin Patient stated she has been using allergy meds and her Flonase and it has not been working/she states shes traveling in am and would like a prescription sent in for a sinus infection/experiencing ear ache .please advise   Has the patient contacted their pharmacy? Yes.   (Agent: If no, request that the patient contact the pharmacy for the refill.) (Agent: If yes, when and what did the pharmacy advise?)  Preferred Pharmacy (with phone number or street name):  [place]  [address] [place] [address]  Phone: [phone] Fax: [phone]     Agent: Please be advised that RX refills may take up to 3 business days. We ask that you follow-up with your pharmacy.

## 2019-08-19 ENCOUNTER — Ambulatory Visit: Payer: No Typology Code available for payment source | Admitting: Dermatology

## 2019-08-19 ENCOUNTER — Other Ambulatory Visit: Payer: Self-pay

## 2019-08-19 DIAGNOSIS — L578 Other skin changes due to chronic exposure to nonionizing radiation: Secondary | ICD-10-CM | POA: Diagnosis not present

## 2019-08-19 DIAGNOSIS — L570 Actinic keratosis: Secondary | ICD-10-CM

## 2019-08-19 DIAGNOSIS — D492 Neoplasm of unspecified behavior of bone, soft tissue, and skin: Secondary | ICD-10-CM

## 2019-08-19 DIAGNOSIS — L821 Other seborrheic keratosis: Secondary | ICD-10-CM

## 2019-08-19 DIAGNOSIS — L820 Inflamed seborrheic keratosis: Secondary | ICD-10-CM | POA: Diagnosis not present

## 2019-08-19 DIAGNOSIS — D229 Melanocytic nevi, unspecified: Secondary | ICD-10-CM

## 2019-08-19 DIAGNOSIS — D485 Neoplasm of uncertain behavior of skin: Secondary | ICD-10-CM

## 2019-08-19 NOTE — Progress Notes (Signed)
   Follow-Up Visit   Subjective  Kayla Alexander is a 49 y.o. female who presents for the following: Actinic Keratosis (6 months f/u check scaly places on her back and chest ). Pt C/o of growth under her Right arms x 1 year growing, no symptoms.  The following portions of the chart were reviewed this encounter and updated as appropriate:  Tobacco  Allergies  Meds  Problems  Med Hx  Surg Hx  Fam Hx      Review of Systems:  No other skin or systemic complaints except as noted in HPI or Assessment and Plan.  Objective  Well appearing patient in no apparent distress; mood and affect are within normal limits.  A focused examination was performed including back, chest, face, arms. Relevant physical exam findings are noted in the Assessment and Plan.  Objective  L nasal ala: Erythematous thin papules/macules with gritty scale.   Objective  Right infraclavicular, back (7): 1.0 cm Erythematous keratotic or waxy stuck-on papule or plaque.   Objective  Right axilla: Fatty tissue    Assessment & Plan    Melanocytic Nevi - Tan-brown and/or pink-flesh-colored symmetric macules and papules - Benign appearing on exam today - Observation - Call clinic for new or changing moles - Recommend daily use of broad spectrum spf 30+ sunscreen to sun-exposed areas.   AK (actinic keratosis) L nasal ala  Destruction of lesion - L nasal ala Complexity: simple   Destruction method: cryotherapy   Informed consent: discussed and consent obtained   Timeout:  patient name, date of birth, surgical site, and procedure verified Lesion destroyed using liquid nitrogen: Yes   Region frozen until ice ball extended beyond lesion: Yes   Outcome: patient tolerated procedure well with no complications   Post-procedure details: wound care instructions given    Inflamed seborrheic keratosis (7) Right infraclavicular, back  Destruction of lesion - Right infraclavicular, back Complexity: simple     Destruction method: cryotherapy   Informed consent: discussed and consent obtained   Timeout:  patient name, date of birth, surgical site, and procedure verified Lesion destroyed using liquid nitrogen: Yes   Region frozen until ice ball extended beyond lesion: Yes   Outcome: patient tolerated procedure well with no complications   Post-procedure details: wound care instructions given    Neoplasm of skin Right axilla  Ectopic breast tissue vs increased fat tissue versus lipoma  Benign-appearing.  Reassured Watch for any changes.  Return for reevaluation if changes.   Actinic Damage severe - diffuse scaly erythematous macules with underlying dyspigmentation - Recommend daily broad spectrum sunscreen SPF 30+ to sun-exposed areas, reapply every 2 hours as needed.  - Call for new or changing lesions.  Seborrheic Keratoses - Stuck-on, waxy, tan-brown papules and plaques  - Discussed benign etiology and prognosis. - Observe - Call for any changes  Return in about 1 year (around 08/18/2020). IMarye Round, CMA, am acting as scribe for Sarina Ser, MD .  Documentation: I have reviewed the above documentation for accuracy and completeness, and I agree with the above.  Sarina Ser, MD

## 2019-08-19 NOTE — Patient Instructions (Signed)
Cryotherapy Aftercare  . Wash gently with soap and water everyday.   . Apply Vaseline and Band-Aid daily until healed.  

## 2019-08-21 ENCOUNTER — Encounter: Payer: Self-pay | Admitting: Dermatology

## 2019-11-24 ENCOUNTER — Ambulatory Visit: Payer: Self-pay

## 2019-12-01 ENCOUNTER — Ambulatory Visit: Payer: Self-pay

## 2019-12-07 ENCOUNTER — Other Ambulatory Visit: Payer: No Typology Code available for payment source

## 2019-12-07 ENCOUNTER — Ambulatory Visit: Payer: No Typology Code available for payment source | Attending: Critical Care Medicine

## 2019-12-07 DIAGNOSIS — Z23 Encounter for immunization: Secondary | ICD-10-CM

## 2019-12-07 NOTE — Progress Notes (Signed)
   Covid-19 Vaccination Clinic  Name:  Kayla Alexander    MRN: [id number] DOB: 1970/12/03  12/07/2019  [name] was observed post Covid-19 immunization for 15 minutes without incident. She was provided with Vaccine Information Sheet and instruction to access the V-Safe system.   [name] was instructed to call 911 with any severe reactions post vaccine: Marland Kitchen Difficulty breathing  . Swelling of face and throat  . A fast heartbeat  . A bad rash all over body  . Dizziness and weakness   Immunizations Administered    Name Date Dose VIS Date Route   Pfizer COVID-19 Vaccine 12/07/2019 10:31 AM 0.3 mL 05/20/2018 Intramuscular   Manufacturer: [place]   Lot: [id number]   [place]: [address]-1

## 2019-12-09 ENCOUNTER — Encounter: Payer: Self-pay | Admitting: Family Medicine

## 2019-12-14 ENCOUNTER — Encounter: Payer: Self-pay | Admitting: Family Medicine

## 2019-12-28 ENCOUNTER — Ambulatory Visit: Payer: No Typology Code available for payment source | Attending: Internal Medicine

## 2019-12-28 DIAGNOSIS — Z23 Encounter for immunization: Secondary | ICD-10-CM

## 2019-12-28 NOTE — Progress Notes (Signed)
   Covid-19 Vaccination Clinic  Name:  Kayla Alexander    MRN: [id number] DOB: 08-07-1970  12/28/2019  [name] was observed post Covid-19 immunization for 15 minutes without incident. She was provided with Vaccine Information Sheet and instruction to access the V-Safe system.   [name] was instructed to call 911 with any severe reactions post vaccine: Marland Kitchen Difficulty breathing  . Swelling of face and throat  . A fast heartbeat  . A bad rash all over body  . Dizziness and weakness   Immunizations Administered    Name Date Dose VIS Date Route   Pfizer COVID-19 Vaccine 12/28/2019 10:52 AM 0.3 mL 05/20/2018 Intramuscular   Manufacturer: [place]   Lot: [id number]   [place]: [address]-5

## 2020-02-02 ENCOUNTER — Ambulatory Visit: Payer: Self-pay

## 2020-02-02 ENCOUNTER — Ambulatory Visit: Payer: Self-pay | Admitting: Adult Health

## 2020-02-02 NOTE — Progress Notes (Deleted)
     Established patient visit   Patient: Kayla Alexander   DOB: 25-Oct-1970   49 y.o. Female  MRN: [id number] Visit Date: 02/02/2020  Today's healthcare provider: Marcille Buffy, FNP   No chief complaint on file.  Subjective    Chest Pain  This is a new problem. The quality of the pain is described as tightness. The pain does not radiate.    ***  {Show patient history (optional):23778::" "}   Medications: Outpatient Medications Prior to Visit  Medication Sig  . amoxicillin (AMOXIL) 875 MG tablet Take 1 tablet (875 mg total) by mouth 2 (two) times daily.  . fluticasone (FLONASE) 50 MCG/ACT nasal spray instill 2 sprays into each nostril once daily   No facility-administered medications prior to visit.    Review of Systems  Cardiovascular: Positive for chest pain.    {Heme  Chem  Endocrine  Serology  Results Review (optional):23779::" "}  Objective    There were no vitals taken for this visit. {Show previous vital signs (optional):23777::" "}  Physical Exam  ***  No results found for any visits on 02/02/20.  Assessment & Plan     ***  No follow-ups on file.      {provider attestation***:1}   Marcille Buffy, [place] [phone] (phone) [phone] (fax)  [place]

## 2020-02-02 NOTE — Telephone Encounter (Signed)
Pt. Reports she had a restless night, did not sleep well. Woke up with chest "tightness only when I take a deep breath." Feels tight in the middle of chest. "Feels better now, not as tight." No shortness of breath, nausea or dizziness.Pain is 3/10. Denies any cough or cold symptoms. States she had stopped eating meat, but ate meat last night and did not feel well. Appointment made.Instructed to go to ED for worsening of symptoms.  Reason for Disposition . [1] Chest pain lasting < 5 minutes AND [2] NO chest pain or cardiac symptoms (e.g., breathing difficulty, sweating) now (Exception: chest pains that last only a few seconds)  Answer Assessment - Initial Assessment Questions 1. LOCATION: "Where does it hurt?"       Middle near throat 2. RADIATION: "Does the pain go anywhere else?" (e.g., into neck, jaw, arms, back)     No 3. ONSET: "When did the chest pain begin?" (Minutes, hours or days)      This morning 4. PATTERN "Does the pain come and go, or has it been constant since it started?"  "Does it get worse with exertion?"      Constant 5. DURATION: "How long does it last" (e.g., seconds, minutes, hours)     Minutes 6. SEVERITY: "How bad is the pain?"  (e.g., Scale 1-10; mild, moderate, or severe)    - MILD (1-3): doesn't interfere with normal activities     - MODERATE (4-7): interferes with normal activities or awakens from sleep    - SEVERE (8-10): excruciating pain, unable to do any normal activities       3 7. CARDIAC RISK FACTORS: "Do you have any history of heart problems or risk factors for heart disease?" (e.g., angina, prior heart attack; diabetes, high blood pressure, high cholesterol, smoker, or strong family history of heart disease)     No 8. PULMONARY RISK FACTORS: "Do you have any history of lung disease?"  (e.g., blood clots in lung, asthma, emphysema, birth control pills)     No 9. CAUSE: "What do you think is causing the chest pain?"     Unsure 10. OTHER SYMPTOMS: "Do you  have any other symptoms?" (e.g., dizziness, nausea, vomiting, sweating, fever, difficulty breathing, cough)       No 11. PREGNANCY: "Is there any chance you are pregnant?" "When was your last menstrual period?"       No  Protocols used: CHEST PAIN-A-AH

## 2020-02-02 NOTE — Telephone Encounter (Signed)
Just FYI. Thanks!  

## 2020-02-10 ENCOUNTER — Encounter: Payer: No Typology Code available for payment source | Admitting: Dermatology

## 2020-03-17 ENCOUNTER — Encounter: Payer: Self-pay | Admitting: Family Medicine

## 2020-03-28 ENCOUNTER — Ambulatory Visit (INDEPENDENT_AMBULATORY_CARE_PROVIDER_SITE_OTHER): Payer: PRIVATE HEALTH INSURANCE | Admitting: Family Medicine

## 2020-03-28 ENCOUNTER — Other Ambulatory Visit: Payer: Self-pay

## 2020-03-28 ENCOUNTER — Encounter: Payer: Self-pay | Admitting: Family Medicine

## 2020-03-28 VITALS — BP 108/80 | HR 78 | Temp 98.0°F | Resp 16 | Ht 66.0 in | Wt 167.5 lb

## 2020-03-28 DIAGNOSIS — Z0000 Encounter for general adult medical examination without abnormal findings: Secondary | ICD-10-CM | POA: Diagnosis not present

## 2020-03-28 DIAGNOSIS — E663 Overweight: Secondary | ICD-10-CM

## 2020-03-28 DIAGNOSIS — Z1231 Encounter for screening mammogram for malignant neoplasm of breast: Secondary | ICD-10-CM

## 2020-03-28 DIAGNOSIS — R590 Localized enlarged lymph nodes: Secondary | ICD-10-CM

## 2020-03-28 DIAGNOSIS — Z1211 Encounter for screening for malignant neoplasm of colon: Secondary | ICD-10-CM | POA: Diagnosis not present

## 2020-03-28 DIAGNOSIS — Z720 Tobacco use: Secondary | ICD-10-CM

## 2020-03-28 DIAGNOSIS — Z1159 Encounter for screening for other viral diseases: Secondary | ICD-10-CM

## 2020-03-28 NOTE — Assessment & Plan Note (Signed)
Discussed importance of healthy weight management Discussed diet and exercise  

## 2020-03-28 NOTE — Patient Instructions (Signed)
Preventive Care 40-50 Years Old, Female Preventive care refers to visits with your health care provider and lifestyle choices that can promote health and wellness. This includes:  A yearly physical exam. This may also be called an annual well check.  Regular dental visits and eye exams.  Immunizations.  Screening for certain conditions.  Healthy lifestyle choices, such as eating a healthy diet, getting regular exercise, not using drugs or products that contain nicotine and tobacco, and limiting alcohol use. What can I expect for my preventive care visit? Physical exam Your health care provider will check your:  Height and weight. This may be used to calculate body mass index (BMI), which tells if you are at a healthy weight.  Heart rate and blood pressure.  Skin for abnormal spots. Counseling Your health care provider may ask you questions about your:  Alcohol, tobacco, and drug use.  Emotional well-being.  Home and relationship well-being.  Sexual activity.  Eating habits.  Work and work environment.  Method of birth control.  Menstrual cycle.  Pregnancy history. What immunizations do I need?  Influenza (flu) vaccine  This is recommended every year. Tetanus, diphtheria, and pertussis (Tdap) vaccine  You may need a Td booster every 10 years. Varicella (chickenpox) vaccine  You may need this if you have not been vaccinated. Zoster (shingles) vaccine  You may need this after age 60. Measles, mumps, and rubella (MMR) vaccine  You may need at least one dose of MMR if you were born in 1957 or later. You may also need a second dose. Pneumococcal conjugate (PCV13) vaccine  You may need this if you have certain conditions and were not previously vaccinated. Pneumococcal polysaccharide (PPSV23) vaccine  You may need one or two doses if you smoke cigarettes or if you have certain conditions. Meningococcal conjugate (MenACWY) vaccine  You may need this if you  have certain conditions. Hepatitis A vaccine  You may need this if you have certain conditions or if you travel or work in places where you may be exposed to hepatitis A. Hepatitis B vaccine  You may need this if you have certain conditions or if you travel or work in places where you may be exposed to hepatitis B. Haemophilus influenzae type b (Hib) vaccine  You may need this if you have certain conditions. Human papillomavirus (HPV) vaccine  If recommended by your health care provider, you may need three doses over 6 months. You may receive vaccines as individual doses or as more than one vaccine together in one shot (combination vaccines). Talk with your health care provider about the risks and benefits of combination vaccines. What tests do I need? Blood tests  Lipid and cholesterol levels. These may be checked every 5 years, or more frequently if you are over 50 years old.  Hepatitis C test.  Hepatitis B test. Screening  Lung cancer screening. You may have this screening every year starting at age 55 if you have a 30-pack-year history of smoking and currently smoke or have quit within the past 15 years.  Colorectal cancer screening. All adults should have this screening starting at age 50 and continuing until age 75. Your health care provider may recommend screening at age 45 if you are at increased risk. You will have tests every 1-10 years, depending on your results and the type of screening test.  Diabetes screening. This is done by checking your blood sugar (glucose) after you have not eaten for a while (fasting). You may have this   done every 1-3 years.  Mammogram. This may be done every 1-2 years. Talk with your health care provider about when you should start having regular mammograms. This may depend on whether you have a family history of breast cancer.  BRCA-related cancer screening. This may be done if you have a family history of breast, ovarian, tubal, or peritoneal  cancers.  Pelvic exam and Pap test. This may be done every 3 years starting at age 82. Starting at age 57, this may be done every 5 years if you have a Pap test in combination with an HPV test. Other tests  Sexually transmitted disease (STD) testing.  Bone density scan. This is done to screen for osteoporosis. You may have this scan if you are at high risk for osteoporosis. Follow these instructions at home: Eating and drinking  Eat a diet that includes fresh fruits and vegetables, whole grains, lean protein, and low-fat dairy.  Take vitamin and mineral supplements as recommended by your health care provider.  Do not drink alcohol if: ? Your health care provider tells you not to drink. ? You are pregnant, may be pregnant, or are planning to become pregnant.  If you drink alcohol: ? Limit how much you have to 0-1 drink a day. ? Be aware of how much alcohol is in your drink. In the [place] one drink equals one 12 oz bottle of beer (355 mL), one 5 oz glass of wine (148 mL), or one 1 oz glass of hard liquor (44 mL). Lifestyle  Take daily care of your teeth and gums.  Stay active. Exercise for at least 30 minutes on 5 or more days each week.  Do not use any products that contain nicotine or tobacco, such as cigarettes, e-cigarettes, and chewing tobacco. If you need help quitting, ask your health care provider.  If you are sexually active, practice safe sex. Use a condom or other form of birth control (contraception) in order to prevent pregnancy and STIs (sexually transmitted infections).  If told by your health care provider, take low-dose aspirin daily starting at age 89. What's next?  Visit your health care provider once a year for a well check visit.  Ask your health care provider how often you should have your eyes and teeth checked.  Stay up to date on all vaccines. This information is not intended to replace advice given to you by your health care provider. Make sure you  discuss any questions you have with your health care provider. Document Revised: 11/21/2017 Document Reviewed: 11/21/2017 Elsevier Patient Education  2020 [employer]  Preventive Care 58-78 Years Old, Female Preventive care refers to visits with your health care provider and lifestyle choices that can promote health and wellness. This includes:  A yearly physical exam. This may also be called an annual well check.  Regular dental visits and eye exams.  Immunizations.  Screening for certain conditions.  Healthy lifestyle choices, such as eating a healthy diet, getting regular exercise, not using drugs or products that contain nicotine and tobacco, and limiting alcohol use. What can I expect for my preventive care visit? Physical exam Your health care provider will check your:  Height and weight. This may be used to calculate body mass index (BMI), which tells if you are at a healthy weight.  Heart rate and blood pressure.  Skin for abnormal spots. Counseling Your health care provider may ask you questions about your:  Alcohol, tobacco, and drug use.  Emotional well-being.  Home and  relationship well-being.  Sexual activity.  Eating habits.  Work and work Statistician.  Method of birth control.  Menstrual cycle.  Pregnancy history. What immunizations do I need?  Influenza (flu) vaccine  This is recommended every year. Tetanus, diphtheria, and pertussis (Tdap) vaccine  You may need a Td booster every 10 years. Varicella (chickenpox) vaccine  You may need this if you have not been vaccinated. Zoster (shingles) vaccine  You may need this after age 67. Measles, mumps, and rubella (MMR) vaccine  You may need at least one dose of MMR if you were born in 1957 or later. You may also need a second dose. Pneumococcal conjugate (PCV13) vaccine  You may need this if you have certain conditions and were not previously vaccinated. Pneumococcal polysaccharide  (PPSV23) vaccine  You may need one or two doses if you smoke cigarettes or if you have certain conditions. Meningococcal conjugate (MenACWY) vaccine  You may need this if you have certain conditions. Hepatitis A vaccine  You may need this if you have certain conditions or if you travel or work in places where you may be exposed to hepatitis A. Hepatitis B vaccine  You may need this if you have certain conditions or if you travel or work in places where you may be exposed to hepatitis B. Haemophilus influenzae type b (Hib) vaccine  You may need this if you have certain conditions. Human papillomavirus (HPV) vaccine  If recommended by your health care provider, you may need three doses over 6 months. You may receive vaccines as individual doses or as more than one vaccine together in one shot (combination vaccines). Talk with your health care provider about the risks and benefits of combination vaccines. What tests do I need? Blood tests  Lipid and cholesterol levels. These may be checked every 5 years, or more frequently if you are over 4 years old.  Hepatitis C test.  Hepatitis B test. Screening  Lung cancer screening. You may have this screening every year starting at age 39 if you have a 30-pack-year history of smoking and currently smoke or have quit within the past 15 years.  Colorectal cancer screening. All adults should have this screening starting at age 65 and continuing until age 35. Your health care provider may recommend screening at age 67 if you are at increased risk. You will have tests every 1-10 years, depending on your results and the type of screening test.  Diabetes screening. This is done by checking your blood sugar (glucose) after you have not eaten for a while (fasting). You may have this done every 1-3 years.  Mammogram. This may be done every 1-2 years. Talk with your health care provider about when you should start having regular mammograms. This may  depend on whether you have a family history of breast cancer.  BRCA-related cancer screening. This may be done if you have a family history of breast, ovarian, tubal, or peritoneal cancers.  Pelvic exam and Pap test. This may be done every 3 years starting at age 57. Starting at age 46, this may be done every 5 years if you have a Pap test in combination with an HPV test. Other tests  Sexually transmitted disease (STD) testing.  Bone density scan. This is done to screen for osteoporosis. You may have this scan if you are at high risk for osteoporosis. Follow these instructions at home: Eating and drinking  Eat a diet that includes fresh fruits and vegetables, whole grains, lean protein, and  low-fat dairy.  Take vitamin and mineral supplements as recommended by your health care provider.  Do not drink alcohol if: ? Your health care provider tells you not to drink. ? You are pregnant, may be pregnant, or are planning to become pregnant.  If you drink alcohol: ? Limit how much you have to 0-1 drink a day. ? Be aware of how much alcohol is in your drink. In the [place] one drink equals one 12 oz bottle of beer (355 mL), one 5 oz glass of wine (148 mL), or one 1 oz glass of hard liquor (44 mL). Lifestyle  Take daily care of your teeth and gums.  Stay active. Exercise for at least 30 minutes on 5 or more days each week.  Do not use any products that contain nicotine or tobacco, such as cigarettes, e-cigarettes, and chewing tobacco. If you need help quitting, ask your health care provider.  If you are sexually active, practice safe sex. Use a condom or other form of birth control (contraception) in order to prevent pregnancy and STIs (sexually transmitted infections).  If told by your health care provider, take low-dose aspirin daily starting at age 52. What's next?  Visit your health care provider once a year for a well check visit.  Ask your health care provider how often you should  have your eyes and teeth checked.  Stay up to date on all vaccines. This information is not intended to replace advice given to you by your health care provider. Make sure you discuss any questions you have with your health care provider. Document Revised: 11/21/2017 Document Reviewed: 11/21/2017 Elsevier Patient Education  2020 [employer].

## 2020-03-28 NOTE — Assessment & Plan Note (Signed)
3-5 minute discussion regarding the harms of tobacco use, the benefits of cessation, and methods of cessation Discussed that there are medication options to help with cessation Patient is currently precontemplative  Will reassess at next visit  

## 2020-03-28 NOTE — Progress Notes (Signed)
Complete physical exam   Patient: Kayla Alexander   DOB: November 16, 1970   50 y.o. Female  MRN: [id number] Visit Date: 03/28/2020  Today's healthcare provider: Lavon Paganini, MD   Chief Complaint  Patient presents with  . Annual Exam   Subjective    Kayla Alexander is a 50 y.o. female who presents today for a complete physical exam.  She reports consuming a general diet. Home exercise routine includes walking. She generally feels fairly well. She reports sleeping fairly well. She does have additional problems to discuss today.  HPI  11/28/2016 Pap-normal 12/10/2013 Mammogram-BI-RADS 1  Having hot flashes and hair feels like it is thinning. Feels like she is going into menopause  Drinks 3-4 Michel[employer] Ultra beer per night and cut and it has helped hot flashes.  Also cut back on smoking and red meat.  Declines mammogram and pap smear.   Past Medical History:  Diagnosis Date  . Allergy   . Anxiety   . Arthritis   . Basal cell carcinoma 08/10/2015   R sup med upper back  . DDD (degenerative disc disease), lumbar   . Depression   . GERD (gastroesophageal reflux disease)   . Gravida 2 para 2    Past Surgical History:  Procedure Laterality Date  . ENDOMETRIAL ABLATION Bilateral 2011   Social History   Socioeconomic History  . Marital status: Divorced    Spouse name: Not on file  . Number of children: 2  . Years of education: Not on file  . Highest education level: Not on file  Occupational History  . Not on file  Tobacco Use  . Smoking status: Current Every Day Smoker    Packs/day: 0.50    Years: 30.00    Pack years: 15.00    Types: Cigarettes  . Smokeless tobacco: Never Used  Vaping Use  . Vaping Use: Never used  Substance and Sexual Activity  . Alcohol use: Yes    Alcohol/week: 12.0 standard drinks    Types: 12 Cans of beer per week  . Drug use: No  . Sexual activity: Yes    Partners: Male    Birth control/protection: Surgical  Other Topics Concern  .  Not on file  Social History Narrative  . Not on file   Social Determinants of Health   Financial Resource Strain: Not on file  Food Insecurity: Not on file  Transportation Needs: Not on file  Physical Activity: Not on file  Stress: Not on file  Social Connections: Not on file  Intimate Partner Violence: Not on file   Family Status  Relation Name Status  . Sister  Alive  . Mother  Alive  . PGM  (Not Specified)  . Father  Alive  . Brother  Deceased  . Mat Aunt  (Not Specified)  . Kayla Alexander  (Not Specified)  . PGF  (Not Specified)   Family History  Problem Relation Age of Onset  . Depression Sister   . Stroke Mother 70       un-controlled diabetes  . Diabetes Mother   . Hypertension Mother   . Heart disease Paternal Grandmother   . Anxiety disorder Paternal Grandmother   . Cancer Paternal Grandmother   . Panic disorder Paternal Grandmother   . Healthy Father   . Cancer Brother        esophageal  . Depression Maternal Aunt   . Anxiety disorder Paternal Uncle   . Panic disorder Paternal Uncle   .  Diabetes Paternal Grandfather    No Known Allergies  Patient Care Team: Erasmo Downer, MD as PCP - General (Family Medicine)   Medications: Outpatient Medications Prior to Visit  Medication Sig  . fluticasone (FLONASE) 50 MCG/ACT nasal spray instill 2 sprays into each nostril once daily  . [DISCONTINUED] amoxicillin (AMOXIL) 875 MG tablet Take 1 tablet (875 mg total) by mouth 2 (two) times daily. (Patient not taking: Reported on 03/28/2020)   No facility-administered medications prior to visit.    Review of Systems  Constitutional: Negative.   HENT: Negative.   Eyes: Negative.   Respiratory: Negative.   Cardiovascular: Negative.   Gastrointestinal: Negative.   Endocrine: Negative.   Genitourinary: Negative.   Musculoskeletal: Negative.   Skin: Negative.   Allergic/Immunologic: Negative.   Neurological: Negative.   Hematological: Negative.    Psychiatric/Behavioral: Negative.     Last CBC Lab Results  Component Value Date   WBC 5.5 03/17/2019   HGB 14.2 03/17/2019   HCT 40.5 03/17/2019   MCV 94 03/17/2019   MCH 33.1 (H) 03/17/2019   RDW 12.2 03/17/2019   PLT 246 03/17/2019   Last metabolic panel Lab Results  Component Value Date   GLUCOSE 93 03/17/2019   NA 141 03/17/2019   K 4.4 03/17/2019   CL 108 (H) 03/17/2019   CO2 18 (L) 03/17/2019   BUN 8 03/17/2019   CREATININE 0.78 03/17/2019   GFRNONAA 90 03/17/2019   GFRAA 104 03/17/2019   CALCIUM 8.7 03/17/2019   PROT 6.3 03/17/2019   ALBUMIN 4.2 03/17/2019   LABGLOB 2.1 03/17/2019   AGRATIO 2.0 03/17/2019   BILITOT 0.5 03/17/2019   ALKPHOS 75 03/17/2019   AST 18 03/17/2019   ALT 10 03/17/2019   ANIONGAP 8 11/03/2017   Last lipids Lab Results  Component Value Date   CHOL 206 (H) 03/17/2019   HDL 52 03/17/2019   LDLCALC 127 (H) 03/17/2019   TRIG 150 (H) 03/17/2019   CHOLHDL 4.0 03/17/2019   Last hemoglobin A1c No results found for: HGBA1C Last thyroid functions Lab Results  Component Value Date   TSH 0.769 06/12/2019      Objective    BP 108/80 (BP Location: Left Arm, Patient Position: Sitting, Cuff Size: Normal)   Pulse 78   Temp 98 F (36.7 C) (Oral)   Resp 16   Ht 5\' 6"  (1.676 m)   Wt 167 lb 8 oz (76 kg)   SpO2 98%   BMI 27.04 kg/m  BP Readings from Last 3 Encounters:  03/28/20 108/80  03/17/19 118/81  11/11/17 120/76   Wt Readings from Last 3 Encounters:  03/28/20 167 lb 8 oz (76 kg)  03/17/19 172 lb (78 kg)  11/11/17 180 lb 9.6 oz (81.9 kg)     Physical Exam Vitals reviewed.  Constitutional:      General: She is not in acute distress.    Appearance: Normal appearance. She is well-developed. She is not diaphoretic.  HENT:     Head: Normocephalic and atraumatic.     Right Ear: Tympanic membrane, ear canal and external ear normal.     Left Ear: Tympanic membrane, ear canal and external ear normal.  Eyes:     General: No  scleral icterus.    Conjunctiva/sclera: Conjunctivae normal.     Pupils: Pupils are equal, round, and reactive to light.  Neck:     Thyroid: No thyromegaly.  Cardiovascular:     Rate and Rhythm: Normal rate and regular rhythm.  Pulses: Normal pulses.     Heart sounds: Normal heart sounds. No murmur heard.   Pulmonary:     Effort: Pulmonary effort is normal. No respiratory distress.     Breath sounds: Normal breath sounds. No wheezing or rales.  Chest:     Comments: Breasts: breasts appear normal, no suspicious masses, no skin or nipple changes. +fullness ?LAD in R axilla.  Abdominal:     General: There is no distension.     Palpations: Abdomen is soft.     Tenderness: There is no abdominal tenderness.  Musculoskeletal:        General: No deformity.     Cervical back: Neck supple.     Right lower leg: No edema.     Left lower leg: No edema.  Lymphadenopathy:     Cervical: No cervical adenopathy.  Skin:    General: Skin is warm and dry.     Findings: No rash.  Neurological:     Mental Status: She is alert and oriented to person, place, and time. Mental status is at baseline.     Sensory: No sensory deficit.     Motor: No weakness.     Gait: Gait normal.  Psychiatric:        Mood and Affect: Mood normal.        Behavior: Behavior normal.        Thought Content: Thought content normal.       Last depression screening scores PHQ 2/9 Scores 03/28/2020 03/17/2019 11/11/2017  PHQ - 2 Score 0 0 0  PHQ- 9 Score 0 0 3   Last fall risk screening Fall Risk  03/28/2020  Falls in the past year? 0  Number falls in past yr: 0  Injury with Fall? 0  Risk for fall due to : No Fall Risks  Follow up Falls evaluation completed   Last Audit-C alcohol use screening Alcohol Use Disorder Test (AUDIT) 03/28/2020  1. How often do you have a drink containing alcohol? 1  2. How many drinks containing alcohol do you have on a typical day when you are drinking? 1  3. How often do you have six  or more drinks on one occasion? 4  AUDIT-C Score 6  4. How often during the last year have you found that you were not able to stop drinking once you had started? 0  5. How often during the last year have you failed to do what was normally expected from you because of drinking? 0  6. How often during the last year have you needed a first drink in the morning to get yourself going after a heavy drinking session? 0  7. How often during the last year have you had a feeling of guilt of remorse after drinking? 0  8. How often during the last year have you been unable to remember what happened the night before because you had been drinking? 0  9. Have you or someone else been injured as a result of your drinking? 0  10. Has a relative or friend or a doctor or another health worker been concerned about your drinking or suggested you cut down? 0  Alcohol Use Disorder Identification Test Final Score (AUDIT) 6  Alcohol Brief Interventions/Follow-up Alcohol Education   A score of 3 or more in women, and 4 or more in men indicates increased risk for alcohol abuse, EXCEPT if all of the points are from question 1   No results found for any visits on  03/28/20.  Assessment & Plan    Routine Health Maintenance and Physical Exam  Exercise Activities and Dietary recommendations Goals   None     Immunization History  Administered Date(s) Administered  . PFIZER SARS-COV-2 Vaccination 12/07/2019, 12/28/2019    Health Maintenance  Topic Date Due  . Hepatitis C Screening  Never done  . COLONOSCOPY (Pts 45-68yrs Insurance coverage will need to be confirmed)  Never done  . PAP SMEAR-Modifier  11/29/2019  . COVID-19 Vaccine (3 - Pfizer risk 4-dose series) 01/25/2020  . INFLUENZA VACCINE  06/23/2020 (Originally 10/25/2019)  . TETANUS/TDAP  01/29/2026  . HIV Screening  Completed    Discussed health benefits of physical activity, and encouraged her to engage in regular exercise appropriate for her age and  condition.  Problem List Items Addressed This Visit      Other   Tobacco abuse    3-5 minute discussion regarding the harms of tobacco use, the benefits of cessation, and methods of cessation Discussed that there are medication options to help with cessation Patient is currently precontemplative  Will reassess at next visit       Overweight    Discussed importance of healthy weight management Discussed diet and exercise        Other Visit Diagnoses    Encounter for annual physical exam    -  Primary   Relevant Orders   Comprehensive metabolic panel   Lipid Panel With LDL/HDL Ratio   Encounter for hepatitis C screening test for low risk patient       Relevant Orders   Hepatitis C antibody   Colon cancer screening       Relevant Orders   Cologuard   Axillary lymphadenopathy       Relevant Orders   MM DIAG BREAST TOMO UNI RIGHT   US BREAST COMPLETE UNI RIGHT INC AXILLA   Breast cancer screening by mammogram       Relevant Orders   MM 3D SCREEN BREAST UNI LEFT       Return in about 1 year (around 03/28/2021) for CPE.     I, Lavon Paganini, MD, have reviewed all documentation for this visit. The documentation on 03/28/20 for the exam, diagnosis, procedures, and orders are all accurate and complete.   Dyron Kawano, Dionne Bucy, MD, MPH [facility]

## 2020-03-30 ENCOUNTER — Inpatient Hospital Stay
Admission: RE | Admit: 2020-03-30 | Discharge: 2020-03-30 | Disposition: A | Discharge status: Home / Self Care | Payer: Self-pay | Source: Ambulatory Visit | Attending: *Deleted | Admitting: *Deleted

## 2020-03-30 ENCOUNTER — Other Ambulatory Visit: Payer: Self-pay | Admitting: *Deleted

## 2020-03-30 DIAGNOSIS — Z1231 Encounter for screening mammogram for malignant neoplasm of breast: Secondary | ICD-10-CM

## 2020-04-01 LAB — COMPREHENSIVE METABOLIC PANEL
ALT: 15 IU/L (ref 0–32)
AST: 17 IU/L (ref 0–40)
Albumin/Globulin Ratio: 1.7 (ref 1.2–2.2)
Albumin: 4 g/dL (ref 3.8–4.8)
Alkaline Phosphatase: 76 IU/L (ref 44–121)
BUN/Creatinine Ratio: 13 (ref 9–23)
BUN: 11 mg/dL (ref 6–24)
Bilirubin Total: 0.4 mg/dL (ref 0.0–1.2)
CO2: 20 mmol/L (ref 20–29)
Calcium: 8.9 mg/dL (ref 8.7–10.2)
Chloride: 107 mmol/L — ABNORMAL HIGH (ref 96–106)
Creatinine, Ser: 0.82 mg/dL (ref 0.57–1.00)
GFR calc Af Amer: 97 mL/min/{1.73_m2} (ref >59)
GFR calc non Af Amer: 84 mL/min/{1.73_m2} (ref >59)
Globulin, Total: 2.4 g/dL (ref 1.5–4.5)
Glucose: 101 mg/dL — ABNORMAL HIGH (ref 65–99)
Potassium: 4.2 mmol/L (ref 3.5–5.2)
Sodium: 141 mmol/L (ref 134–144)
Total Protein: 6.4 g/dL (ref 6.0–8.5)

## 2020-04-01 LAB — LIPID PANEL WITH LDL/HDL RATIO
Cholesterol, Total: 198 mg/dL (ref 100–199)
HDL: 52 mg/dL (ref >39)
LDL Chol Calc (NIH): 125 mg/dL — ABNORMAL HIGH (ref 0–99)
LDL/HDL Ratio: 2.4 ratio (ref 0.0–3.2)
Triglycerides: 119 mg/dL (ref 0–149)
VLDL Cholesterol Cal: 21 mg/dL (ref 5–40)

## 2020-04-01 LAB — ABO/RH: Rh Factor: POSITIVE

## 2020-04-01 LAB — HEPATITIS C ANTIBODY: Hep C Virus Ab: <0.1 s/co ratio (ref 0.0–0.9)

## 2020-04-08 ENCOUNTER — Other Ambulatory Visit: Payer: Self-pay

## 2020-04-08 ENCOUNTER — Ambulatory Visit
Admission: RE | Admit: 2020-04-08 | Discharge: 2020-04-08 | Disposition: A | Discharge status: Home / Self Care | Payer: No Typology Code available for payment source | Source: Ambulatory Visit | Attending: Family Medicine | Admitting: Family Medicine

## 2020-04-08 DIAGNOSIS — R590 Localized enlarged lymph nodes: Secondary | ICD-10-CM

## 2020-04-12 ENCOUNTER — Telehealth: Payer: Self-pay

## 2020-04-12 NOTE — Telephone Encounter (Signed)
-----   Message from Virginia Crews, MD sent at 04/12/2020 11:18 AM EST ----- Normal mammogram and ultrasound.  There are no enlarged lymph nodes found in the armpit.  This area of fullness was found to be a fat pad instead.

## 2020-04-12 NOTE — Telephone Encounter (Signed)
Pt advised.   Thanks,   -Fayelynn Distel  

## 2020-04-26 ENCOUNTER — Encounter: Payer: Self-pay | Admitting: Family Medicine

## 2020-06-27 ENCOUNTER — Encounter: Payer: Self-pay | Admitting: Family Medicine

## 2020-06-27 NOTE — Telephone Encounter (Signed)
Recommend eval with [place] or ENT

## 2020-07-01 ENCOUNTER — Encounter: Payer: Self-pay | Admitting: Family Medicine

## 2020-07-01 DIAGNOSIS — J3089 Other allergic rhinitis: Secondary | ICD-10-CM

## 2020-07-01 MED ORDER — FLUTICASONE PROPIONATE 50 MCG/ACT NA SUSP
NASAL | 3 refills | Status: DC
Start: 2020-07-01 — End: 2021-03-30

## 2020-07-06 ENCOUNTER — Other Ambulatory Visit: Payer: Self-pay | Admitting: *Deleted

## 2020-07-06 DIAGNOSIS — J019 Acute sinusitis, unspecified: Secondary | ICD-10-CM

## 2020-07-06 MED ORDER — AZITHROMYCIN 250 MG PO TABS: ORAL_TABLET | ORAL | 0 refills | Status: DC

## 2020-07-07 MED ORDER — AMOXICILLIN 500 MG PO CAPS
500.0000 mg | ORAL_CAPSULE | Freq: Three times a day (TID) | ORAL | 0 refills | Status: DC
Start: 2020-07-07 — End: 2020-10-20

## 2020-07-07 NOTE — Addendum Note (Signed)
Addended by: Wilburt Finlay on: 07/07/2020 09:05 AM   Modules accepted: Orders

## 2020-09-19 ENCOUNTER — Encounter: Payer: Self-pay | Admitting: Family Medicine

## 2020-09-29 NOTE — Telephone Encounter (Signed)
Can double book a virtual in my 1pm slot or see if anyone else or other offices have availability

## 2020-10-17 ENCOUNTER — Encounter: Payer: Self-pay | Admitting: Family Medicine

## 2020-10-18 ENCOUNTER — Ambulatory Visit: Payer: Self-pay | Admitting: *Deleted

## 2020-10-18 NOTE — Telephone Encounter (Signed)
FYI. Appt scheduled.

## 2020-10-18 NOTE — Telephone Encounter (Signed)
Reason for Disposition  [id number] Systolic BP  >= [id number] OR Diastolic >= 80 AND [id number] not taking BP medications  Answer Assessment - Initial Assessment Questions 1. BLOOD PRESSURE: "What is the blood pressure?" "Did you take at least two measurements 5 minutes apart?"     137/98, 141/99 2. ONSET: "When did you take your blood pressure?"     8:30am, 2:25pm 3. HOW: "How did you obtain the blood pressure?" (e.g., visiting nurse, automatic home BP monitor)     Automatic cuff- wrist 4. HISTORY: "Do you have a history of high blood pressure?"     no 5. MEDICATIONS: "Are you taking any medications for blood pressure?" "Have you missed any doses recently?"     no 6. OTHER SYMPTOMS: "Do you have any symptoms?" (e.g., headache, chest pain, blurred vision, difficulty breathing, weakness)     Frequent headache, increased sweating 7. PREGNANCY: "Is there any chance you are pregnant?" "When was your last menstrual period?"     N/a  Protocols used: Blood Pressure - High-A-AH

## 2020-10-18 NOTE — Telephone Encounter (Signed)
Noted  

## 2020-10-18 NOTE — Telephone Encounter (Signed)
Patient is calling with concerns of high BP- Saturday she was at [facility] and she had reading- 140/108. Patient reports she is having more frequent headaches and she states her BP has been elevated. Patient has been scheduled for appointment.

## 2020-10-20 ENCOUNTER — Ambulatory Visit: Payer: No Typology Code available for payment source | Admitting: Family Medicine

## 2020-10-20 ENCOUNTER — Other Ambulatory Visit: Payer: Self-pay

## 2020-10-20 ENCOUNTER — Encounter: Payer: Self-pay | Admitting: Family Medicine

## 2020-10-20 VITALS — BP 116/86 | HR 76 | Temp 98.5°F | Wt 169.0 lb

## 2020-10-20 DIAGNOSIS — R030 Elevated blood-pressure reading, without diagnosis of hypertension: Secondary | ICD-10-CM

## 2020-10-20 DIAGNOSIS — Z720 Tobacco use: Secondary | ICD-10-CM | POA: Diagnosis not present

## 2020-10-20 DIAGNOSIS — G43709 Chronic migraine without aura, not intractable, without status migrainosus: Secondary | ICD-10-CM | POA: Diagnosis not present

## 2020-10-20 MED ORDER — SUMATRIPTAN SUCCINATE 100 MG PO TABS: 100.0000 mg | ORAL_TABLET | ORAL | 2 refills | Status: DC | PRN

## 2020-10-20 NOTE — Assessment & Plan Note (Signed)
-   Stable, BP WNL in office today - Acute, uncomplicated problem, likely exacerbated by recent headache episodes - Continue to monitor

## 2020-10-20 NOTE — Progress Notes (Signed)
Established patient visit   Patient: Kayla Alexander   DOB: 02/21/1971   50 y.o. Female  MRN: [id number] Visit Date: 10/20/2020  Today's healthcare provider: Lavon Paganini, MD   Chief Complaint  Patient presents with   Hypertension   Subjective    HPI  Elevated Blood Pressure - Pt. presents for BP eval due to recent home reading of 140/108 - BP is WNL in office today (116/86)  Headaches - Pt. reports that HA's are unilateral, throbbing, "constant" and daily for the past 2 weeks; pt. reports associated photophobia, tinnitus, & nausea - Denies h/o HA disorder; pt. has recently started menopause - Denies visual disturbances, aura - Pt. states that she recently stopped drinking EtOH & smoking cigarettes in order to manage the HA's - Pt. drinks 2 caffeinated sodas per day, which has been stable; denies other caffeine usage - Pt. endorses history of snoring, but denies daytime fatigue or morning onset of headaches    Medications: Outpatient Medications Prior to Visit  Medication Sig   fluticasone (FLONASE) 50 MCG/ACT nasal spray instill 2 sprays into each nostril once daily   [DISCONTINUED] amoxicillin (AMOXIL) 500 MG capsule Take 1 capsule (500 mg total) by mouth 3 (three) times daily.   [DISCONTINUED] azithromycin (ZITHROMAX Z-PAK) 250 MG tablet Take 2 Tablets Day One, Than One Tablet daily Until Complete   No facility-administered medications prior to visit.    Review of Systems  Constitutional:  Negative for activity change, chills and fever.  HENT: Negative.    Eyes: Negative.   Respiratory:  Negative for chest tightness and shortness of breath.   Cardiovascular:  Negative for chest pain and leg swelling.  Gastrointestinal:  Positive for nausea.  Endocrine: Negative.   Genitourinary: Negative.   Musculoskeletal: Negative.   Skin: Negative.   Neurological:  Positive for headaches. Negative for dizziness, syncope, facial asymmetry, speech difficulty and  light-headedness.  Psychiatric/Behavioral: Negative.        Objective    BP 116/86 (BP Location: Left Arm, Patient Position: Sitting, Cuff Size: Normal)   Pulse 76   Temp 98.5 F (36.9 C) (Oral)   Wt 169 lb (76.7 kg)   SpO2 98%   BMI 27.28 kg/m    Physical Exam Constitutional:      General: She is not in acute distress.    Appearance: Normal appearance. She is normal weight.  HENT:     Head: Normocephalic and atraumatic.     Right Ear: External ear normal.     Left Ear: External ear normal.  Eyes:     Conjunctiva/sclera: Conjunctivae normal.  Cardiovascular:     Rate and Rhythm: Normal rate and regular rhythm.     Pulses: Normal pulses.     Heart sounds: Normal heart sounds.  Pulmonary:     Effort: Pulmonary effort is normal.     Breath sounds: Normal breath sounds.  Abdominal:     General: Abdomen is flat. Bowel sounds are normal. There is no distension.     Palpations: Abdomen is soft.     Tenderness: There is no abdominal tenderness.  Musculoskeletal:     Cervical back: Normal range of motion and neck supple. No rigidity or tenderness.  Skin:    General: Skin is warm and dry.  Neurological:     General: No focal deficit present.     Mental Status: She is alert and oriented to person, place, and time. Mental status is at baseline.     Cranial  Nerves: No cranial nerve deficit.     Motor: No weakness.     No results found for any visits on 10/20/20.  Assessment & Plan     Problem List Items Addressed This Visit       Cardiovascular and Mediastinum   Chronic migraine without aura without status migrainosus, not intractable - Primary    - Acute, uncomplicated problem - Counseled pt. to continue avoiding EtOH & tobacco products, as these may trigger migraine episodes - Start Sumatriptan 100 mg prn for migraines - May consider ordering sleep study at a later time if headaches persist - Counseled pt. to contact clinic if symptoms worsen or persist - RTC in 3  months for re-eval       Relevant Medications   SUMAtriptan (IMITREX) 100 MG tablet     Other   Tobacco abuse    Improving, pt. notes that she discontinued tobacco use during the past 2 weeks for HA relief; provided cessation counseling and recommended pt. to continue avoiding tobacco use to prevent migraine triggers; congratulated pt. for successful cessation during the past 2 weeks       Elevated BP without diagnosis of hypertension    - Stable, BP WNL in office today - Acute, uncomplicated problem, likely exacerbated by recent headache episodes - Continue to monitor         Return in about 3 months (around 01/20/2021) for chronic disease f/u.        Percell Locus, MS3  Patient seen along with MS3 student Percell Locus. I personally evaluated this patient along with the student, and verified all aspects of the history, physical exam, and medical decision making as documented by the student. I agree with the student's documentation and have made all necessary edits.  Krzysztof Reichelt, Dionne Bucy, MD, MPH [facility]

## 2020-10-20 NOTE — Assessment & Plan Note (Signed)
Improving, pt. notes that she discontinued tobacco use during the past 2 weeks for HA relief; provided cessation counseling and recommended pt. to continue avoiding tobacco use to prevent migraine triggers; congratulated pt. for successful cessation during the past 2 weeks

## 2020-10-20 NOTE — Assessment & Plan Note (Signed)
-   Acute, uncomplicated problem - Counseled pt. to continue avoiding EtOH & tobacco products, as these may trigger migraine episodes - Start Sumatriptan 100 mg prn for migraines - May consider ordering sleep study at a later time if headaches persist - Counseled pt. to contact clinic if symptoms worsen or persist - RTC in 3 months for re-eval

## 2020-10-21 ENCOUNTER — Other Ambulatory Visit: Payer: Self-pay

## 2020-10-21 ENCOUNTER — Encounter: Payer: Self-pay | Admitting: Family Medicine

## 2020-10-21 NOTE — Telephone Encounter (Signed)
Ok to send in propranolol ER '40mg'$  daily #30 r2. Thanks

## 2020-10-24 MED ORDER — PROPRANOLOL HCL 40 MG PO TABS: 40.0000 mg | ORAL_TABLET | Freq: Two times a day (BID) | ORAL | 2 refills | Status: DC

## 2020-11-01 ENCOUNTER — Encounter: Payer: Self-pay | Admitting: Family Medicine

## 2020-11-20 ENCOUNTER — Encounter: Payer: Self-pay | Admitting: Family Medicine

## 2020-11-21 MED ORDER — PROPRANOLOL HCL 10 MG PO TABS: 10.0000 mg | ORAL_TABLET | Freq: Two times a day (BID) | ORAL | 3 refills | Status: DC

## 2021-01-19 ENCOUNTER — Encounter: Payer: Self-pay | Admitting: Family Medicine

## 2021-01-20 ENCOUNTER — Ambulatory Visit: Payer: No Typology Code available for payment source | Admitting: Family Medicine

## 2021-03-15 ENCOUNTER — Encounter: Payer: Self-pay | Admitting: Family Medicine

## 2021-03-16 NOTE — Telephone Encounter (Signed)
She needs virtual or in person visit for eval - can be with any provider. If nothing available [facility] virtual or [facility]is[facility] is a good option.

## 2021-03-29 NOTE — Progress Notes (Signed)
Complete physical exam   Patient: Kayla Alexander   DOB: [birth year]   51 y.o. Female  MRN: [id number] Visit Date: 03/30/2021  Today's healthcare provider: Lavon Paganini, MD   Chief Complaint  Patient presents with   Annual Exam   Subjective    Kayla Alexander is a 51 y.o. female who presents today for a complete physical exam.  She reports consuming a general diet. The patient does not participate in regular exercise at present. She generally feels well. She reports sleeping fairly well. She does have additional problems to discuss today. Sore throat/sinus a couple of days ago. She has tried sinus medicines.  HPI  Pap: wants to wait until next year.  Past Medical History:  Diagnosis Date   Allergy    Anxiety    Arthritis    Basal cell carcinoma 08/10/2015   R sup med upper back   DDD (degenerative disc disease), lumbar    Depression    GERD (gastroesophageal reflux disease)    Gravida 2 para 2    Past Surgical History:  Procedure Laterality Date   ENDOMETRIAL ABLATION Bilateral 2011   Social History   Socioeconomic History   Marital status: Divorced    Spouse name: Not on file   Number of children: 2   Years of education: Not on file   Highest education level: Not on file  Occupational History   Not on file  Tobacco Use   Smoking status: Every Day    Packs/day: 0.50    Years: 30.00    Pack years: 15.00    Types: Cigarettes   Smokeless tobacco: Former   Tobacco comments:    Cutting back, down to 2 packs per week  Vaping Use   Vaping Use: Never used  Substance and Sexual Activity   Alcohol use: Yes    Alcohol/week: 12.0 standard drinks    Types: 12 Cans of beer per week   Drug use: No   Sexual activity: Yes    Partners: Male    Birth control/protection: Surgical  Other Topics Concern   Not on file  Social History Narrative   Not on file   Social Determinants of Health   Financial Resource Strain: Not on file  Food Insecurity: Not on file   Transportation Needs: Not on file  Physical Activity: Not on file  Stress: Not on file  Social Connections: Not on file  Intimate Partner Violence: Not on file   Family Status  Relation Name Status   Sister  Alive   Mother  Alive   PGM  (Not Specified)   Father  Alive   Brother  Deceased   Mat Aunt  (Not Specified)   Psychiatrist  (Not Specified)   PGF  (Not Specified)   Neg Hx  (Not Specified)   Family History  Problem Relation Age of Onset   Depression Sister    Stroke Mother 61       un-controlled diabetes   Diabetes Mother    Hypertension Mother    Heart disease Paternal Grandmother    Anxiety disorder Paternal Grandmother    Cancer Paternal Grandmother    Panic disorder Paternal 63    Healthy Father    Cancer Brother        esophageal   Depression Maternal Aunt    Anxiety disorder Paternal Uncle    Panic disorder Paternal Uncle    Diabetes Paternal Grandfather    Breast cancer Neg Hx  No Known Allergies  Patient Care Team: Virginia Crews, MD as PCP - General (Family Medicine)   Medications: Outpatient Medications Prior to Visit  Medication Sig   [DISCONTINUED] fluticasone (FLONASE) 50 MCG/ACT nasal spray instill 2 sprays into each nostril once daily   [DISCONTINUED] propranolol (INDERAL) 10 MG tablet Take 1 tablet (10 mg total) by mouth 2 (two) times daily. (Patient not taking: Reported on 03/30/2021)   [DISCONTINUED] SUMAtriptan (IMITREX) 100 MG tablet Take 1 tablet (100 mg total) by mouth every 2 (two) hours as needed for migraine. May repeat in 2 hours if headache persists or recurs. (Patient not taking: Reported on 03/30/2021)   No facility-administered medications prior to visit.    Review of Systems per HPI    Objective    BP 117/80 (BP Location: Right Arm, Patient Position: Sitting, Cuff Size: Large)    Pulse 76    Temp 98.5 F (36.9 C) (Oral)    Resp 16    Ht 5\' 6"  (1.676 m)    Wt 171 lb 8 oz (77.8 kg)    BMI 27.68 kg/m     Physical Exam Vitals reviewed.  Constitutional:      General: She is not in acute distress.    Appearance: Normal appearance. She is well-developed. She is not diaphoretic.  HENT:     Head: Normocephalic and atraumatic.     Right Ear: Tympanic membrane, ear canal and external ear normal.     Left Ear: Tympanic membrane, ear canal and external ear normal.     Nose: Nose normal.     Mouth/Throat:     Mouth: Mucous membranes are moist.     Pharynx: Oropharynx is clear. No oropharyngeal exudate.  Eyes:     General: No scleral icterus.    Conjunctiva/sclera: Conjunctivae normal.     Pupils: Pupils are equal, round, and reactive to light.  Neck:     Thyroid: No thyromegaly.  Cardiovascular:     Rate and Rhythm: Normal rate and regular rhythm.     Pulses: Normal pulses.     Heart sounds: Normal heart sounds. No murmur heard. Pulmonary:     Effort: Pulmonary effort is normal. No respiratory distress.     Breath sounds: Normal breath sounds. No wheezing or rales.  Abdominal:     General: There is no distension.     Palpations: Abdomen is soft.     Tenderness: There is no abdominal tenderness.  Musculoskeletal:        General: No deformity.     Cervical back: Neck supple.     Right lower leg: No edema.     Left lower leg: No edema.  Lymphadenopathy:     Cervical: No cervical adenopathy.  Skin:    General: Skin is warm and dry.     Findings: No rash.  Neurological:     Mental Status: She is alert and oriented to person, place, and time. Mental status is at baseline.     Sensory: No sensory deficit.     Motor: No weakness.     Gait: Gait normal.  Psychiatric:        Mood and Affect: Mood normal.        Behavior: Behavior normal.        Thought Content: Thought content normal.      Last depression screening scores PHQ 2/9 Scores 03/30/2021 10/20/2020 03/28/2020  PHQ - 2 Score 0 0 0  PHQ- 9 Score - 6 0   Last  fall risk screening Fall Risk  03/30/2021  Falls in the past  year? 0  Number falls in past yr: 0  Injury with Fall? 0  Risk for fall due to : -  Follow up -   Last Audit-C alcohol use screening Alcohol Use Disorder Test (AUDIT) 10/20/2020  1. How often do you have a drink containing alcohol? 3  2. How many drinks containing alcohol do you have on a typical day when you are drinking? 1  3. How often do you have six or more drinks on one occasion? 1  AUDIT-C Score 5  4. How often during the last year have you found that you were not able to stop drinking once you had started? -  5. How often during the last year have you failed to do what was normally expected from you because of drinking? -  6. How often during the last year have you needed a first drink in the morning to get yourself going after a heavy drinking session? -  7. How often during the last year have you had a feeling of guilt of remorse after drinking? -  8. How often during the last year have you been unable to remember what happened the night before because you had been drinking? -  9. Have you or someone else been injured as a result of your drinking? -  10. Has a relative or friend or a doctor or another health worker been concerned about your drinking or suggested you cut down? -  Alcohol Use Disorder Identification Test Final Score (AUDIT) -  Alcohol Brief Interventions/Follow-up -   A score of 3 or more in women, and 4 or more in men indicates increased risk for alcohol abuse, EXCEPT if all of the points are from question 1   No results found for any visits on 03/30/21.  Assessment & Plan    Routine Health Maintenance and Physical Exam  Exercise Activities and Dietary recommendations  Goals   None     Immunization History  Administered Date(s) Administered   PFIZER(Purple Top)SARS-COV-2 Vaccination 12/07/2019, 12/28/2019    Health Maintenance  Topic Date Due   Pneumococcal Vaccine 64-42 Years old (1 - PCV) Never done   Zoster Vaccines- Shingrix (1 of 2) Never done    PAP SMEAR-Modifier  11/29/2019   COVID-19 Vaccine (3 - Pfizer risk series) 01/25/2020   Fecal DNA (Cologuard)  Never done   INFLUENZA VACCINE  06/23/2021 (Originally 10/24/2020)   MAMMOGRAM  04/08/2022   TETANUS/TDAP  01/29/2026   Hepatitis C Screening  Completed   HIV Screening  Completed   HPV VACCINES  Aged Out    Discussed health benefits of physical activity, and encouraged her to engage in regular exercise appropriate for her age and condition.  Problem List Items Addressed This Visit       Other   Tobacco abuse    Encourage cessation      Environmental and seasonal allergies    Resume flonase      Relevant Medications   fluticasone (FLONASE) 50 MCG/ACT nasal spray   Overweight    Discussed importance of healthy weight management Discussed diet and exercise       Other Visit Diagnoses     Encounter for annual physical exam    -  Primary   Relevant Orders   Comprehensive metabolic panel   Lipid panel   Colon cancer screening       Relevant Orders   Cologuard  Return in about 1 year (around 03/30/2022) for CPE.     I, Lavon Paganini, MD, have reviewed all documentation for this visit. The documentation on 03/30/21 for the exam, diagnosis, procedures, and orders are all accurate and complete.   Didier Brandenburg, Dionne Bucy, MD, MPH [facility]

## 2021-03-30 ENCOUNTER — Other Ambulatory Visit: Payer: Self-pay

## 2021-03-30 ENCOUNTER — Encounter: Payer: Self-pay | Admitting: Family Medicine

## 2021-03-30 ENCOUNTER — Ambulatory Visit (INDEPENDENT_AMBULATORY_CARE_PROVIDER_SITE_OTHER): Payer: No Typology Code available for payment source | Admitting: Family Medicine

## 2021-03-30 VITALS — BP 117/80 | HR 76 | Temp 98.5°F | Resp 16 | Ht 66.0 in | Wt 171.5 lb

## 2021-03-30 DIAGNOSIS — J3089 Other allergic rhinitis: Secondary | ICD-10-CM

## 2021-03-30 DIAGNOSIS — Z1211 Encounter for screening for malignant neoplasm of colon: Secondary | ICD-10-CM

## 2021-03-30 DIAGNOSIS — Z0000 Encounter for general adult medical examination without abnormal findings: Secondary | ICD-10-CM

## 2021-03-30 DIAGNOSIS — Z720 Tobacco use: Secondary | ICD-10-CM

## 2021-03-30 DIAGNOSIS — E663 Overweight: Secondary | ICD-10-CM

## 2021-03-30 MED ORDER — FLUTICASONE PROPIONATE 50 MCG/ACT NA SUSP: NASAL | 3 refills | Status: DC

## 2021-03-30 NOTE — Assessment & Plan Note (Signed)
Resume flonase.

## 2021-03-30 NOTE — Assessment & Plan Note (Signed)
Encourage cessation. °

## 2021-03-30 NOTE — Assessment & Plan Note (Signed)
Discussed importance of healthy weight management Discussed diet and exercise  

## 2021-03-30 NOTE — Patient Instructions (Signed)
The CDC recommends two doses of Shingrix (the shingles vaccine) separated by 2 to 6 months for adults age 51 years and older. I recommend checking with your insurance plan regarding coverage for this vaccine.    Think about the pneumonia shot (prevnar 11) also

## 2021-04-01 LAB — LIPID PANEL
Chol/HDL Ratio: 4.6 ratio — ABNORMAL HIGH (ref 0.0–4.4)
Cholesterol, Total: 209 mg/dL — ABNORMAL HIGH (ref 100–199)
HDL: 45 mg/dL (ref >39)
LDL Chol Calc (NIH): 144 mg/dL — ABNORMAL HIGH (ref 0–99)
Triglycerides: 109 mg/dL (ref 0–149)
VLDL Cholesterol Cal: 20 mg/dL (ref 5–40)

## 2021-04-01 LAB — COMPREHENSIVE METABOLIC PANEL
ALT: 17 IU/L (ref 0–32)
AST: 18 IU/L (ref 0–40)
Albumin/Globulin Ratio: 1.8 (ref 1.2–2.2)
Albumin: 4.4 g/dL (ref 3.8–4.8)
Alkaline Phosphatase: 76 IU/L (ref 44–121)
BUN/Creatinine Ratio: 12 (ref 9–23)
BUN: 10 mg/dL (ref 6–24)
Bilirubin Total: 0.5 mg/dL (ref 0.0–1.2)
CO2: 23 mmol/L (ref 20–29)
Calcium: 9.4 mg/dL (ref 8.7–10.2)
Chloride: 107 mmol/L — ABNORMAL HIGH (ref 96–106)
Creatinine, Ser: 0.86 mg/dL (ref 0.57–1.00)
Globulin, Total: 2.4 g/dL (ref 1.5–4.5)
Glucose: 91 mg/dL (ref 70–99)
Potassium: 4.3 mmol/L (ref 3.5–5.2)
Sodium: 143 mmol/L (ref 134–144)
Total Protein: 6.8 g/dL (ref 6.0–8.5)
eGFR: 82 mL/min/{1.73_m2} (ref >59)

## 2021-04-04 ENCOUNTER — Encounter: Payer: Self-pay | Admitting: Family Medicine

## 2021-04-18 LAB — COLOGUARD: COLOGUARD: NEGATIVE

## 2021-04-24 ENCOUNTER — Encounter: Payer: Self-pay | Admitting: Family Medicine

## 2021-06-14 ENCOUNTER — Encounter: Payer: Self-pay | Admitting: Family Medicine

## 2021-06-14 MED ORDER — CETIRIZINE HCL 10 MG PO TABS: 10.0000 mg | ORAL_TABLET | Freq: Every day | ORAL | 3 refills | Status: DC

## 2021-08-28 NOTE — Progress Notes (Unsigned)
Established patient visit  I,Joseline E Rosas,acting as a scribe for Lavon Paganini, MD.,have documented all relevant documentation on the behalf of Lavon Paganini, MD,as directed by  Lavon Paganini, MD while in the presence of Lavon Paganini, MD.   Patient: Kayla Alexander   DOB: 04-Nov-1970   51 y.o. Female  MRN: [id number] Visit Date: 08/29/2021  Today's healthcare provider: Lavon Paganini, MD   Chief Complaint  Patient presents with   Hypertension   Subjective    HPI  Hypertension: Patient is here for evaluation of elevated blood pressures.  Age at onset of elevated blood pressure:  50.Cardiac symptoms fatigue. Patient denies chest pain, chest pressure/discomfort, exertional chest pressure/discomfort, irregular heart beat, lower extremity edema, near-syncope, palpitations, and syncope.  Cardiovascular risk factors: dyslipidemia. Use of agents associated with hypertension: none. Family history of Hypertension. Home Blood Pressure readings are 150-170/100-115  Patient's BP machine readings today in office are 142/103 P:72  Has been doing more exercising and cutting back on high cholesterol foods since last visit.   Medications: Outpatient Medications Prior to Visit  Medication Sig   cetirizine (ZYRTEC) 10 MG tablet Take 1 tablet (10 mg total) by mouth daily.   fluticasone (FLONASE) 50 MCG/ACT nasal spray instill 2 sprays into each nostril once daily   No facility-administered medications prior to visit.    Review of Systems per HPI     Objective    BP 130/90 (BP Location: Right Arm, Patient Position: Sitting, Cuff Size: Normal)   Pulse 76   Temp 98.4 F (36.9 C) (Oral)   Resp 16   Ht '5\' 5"'$  (1.651 m)   Wt 167 lb 1.6 oz (75.8 kg)   SpO2 98%   BMI 27.81 kg/m    Physical Exam Vitals reviewed.  Constitutional:      General: She is not in acute distress.    Appearance: Normal appearance. She is well-developed. She is not diaphoretic.  HENT:      Head: Normocephalic and atraumatic.  Eyes:     General: No scleral icterus.    Conjunctiva/sclera: Conjunctivae normal.  Neck:     Thyroid: No thyromegaly.  Cardiovascular:     Rate and Rhythm: Normal rate and regular rhythm.     Pulses: Normal pulses.     Heart sounds: Normal heart sounds. No murmur heard. Pulmonary:     Effort: Pulmonary effort is normal. No respiratory distress.     Breath sounds: Normal breath sounds. No wheezing, rhonchi or rales.  Musculoskeletal:     Cervical back: Neck supple.     Right lower leg: No edema.     Left lower leg: No edema.  Lymphadenopathy:     Cervical: No cervical adenopathy.  Skin:    General: Skin is warm and dry.  Neurological:     Mental Status: She is alert and oriented to person, place, and time. Mental status is at baseline.  Psychiatric:        Mood and Affect: Mood normal.        Behavior: Behavior normal.      No results found for any visits on 08/29/21.  Assessment & Plan     Problem List Items Addressed This Visit       Cardiovascular and Mediastinum   Primary hypertension - Primary    New diagnosis Home and in office readings high She is symptomatic with occasional headaches and swelling Discussed low sodium diet Start HCTZ 12.5 mg daily Recheck metabolic panel at next  visit       Relevant Medications   hydrochlorothiazide (HYDRODIURIL) 12.5 MG tablet     Return in about 4 weeks (around 09/26/2021) for BP f/u.      I, Lavon Paganini, MD, have reviewed all documentation for this visit. The documentation on 08/29/21 for the exam, diagnosis, procedures, and orders are all accurate and complete.   Bacigalupo, Dionne Bucy, MD, MPH [facility]

## 2021-08-29 ENCOUNTER — Encounter: Payer: Self-pay | Admitting: Family Medicine

## 2021-08-29 ENCOUNTER — Ambulatory Visit: Payer: No Typology Code available for payment source | Admitting: Family Medicine

## 2021-08-29 VITALS — BP 130/90 | HR 76 | Temp 98.4°F | Resp 16 | Ht 65.0 in | Wt 167.1 lb

## 2021-08-29 DIAGNOSIS — I10 Essential (primary) hypertension: Secondary | ICD-10-CM

## 2021-08-29 MED ORDER — HYDROCHLOROTHIAZIDE 12.5 MG PO TABS: 12.5000 mg | ORAL_TABLET | Freq: Every day | ORAL | 3 refills | Status: DC

## 2021-08-29 NOTE — Assessment & Plan Note (Signed)
New diagnosis Home and in office readings high She is symptomatic with occasional headaches and swelling Discussed low sodium diet Start HCTZ 12.5 mg daily Recheck metabolic panel at next visit

## 2021-09-25 ENCOUNTER — Encounter: Payer: Self-pay | Admitting: Family Medicine

## 2021-09-25 ENCOUNTER — Ambulatory Visit: Payer: No Typology Code available for payment source | Admitting: Family Medicine

## 2021-09-25 VITALS — BP 112/86 | HR 82 | Temp 98.0°F | Resp 16 | Ht 66.0 in | Wt 163.5 lb

## 2021-09-25 DIAGNOSIS — I10 Essential (primary) hypertension: Secondary | ICD-10-CM

## 2021-09-25 DIAGNOSIS — Z720 Tobacco use: Secondary | ICD-10-CM

## 2021-09-25 DIAGNOSIS — F1010 Alcohol abuse, uncomplicated: Secondary | ICD-10-CM | POA: Diagnosis not present

## 2021-09-25 NOTE — Progress Notes (Signed)
Established patient visit   Patient: Kayla Alexander   DOB: 1970/12/14   51 y.o. Female  MRN: [id number] Visit Date: 09/25/2021  Today's healthcare provider: Myles Gip, DO   I,Tiffany J Bragg,acting as a scribe for Myles Gip, DO.,have documented all relevant documentation on the behalf of Myles Gip, DO,as directed by  Myles Gip, DO while in the presence of Myles Gip, DO.   Chief Complaint  Patient presents with   Hypertension   Subjective    HPI  Hypertension, follow-up  BP Readings from Last 3 Encounters:  09/25/21 112/86  08/29/21 130/90  03/30/21 117/80   Wt Readings from Last 3 Encounters:  09/25/21 163 lb 8 oz (74.2 kg)  08/29/21 167 lb 1.6 oz (75.8 kg)  03/30/21 171 lb 8 oz (77.8 kg)     She was last seen for hypertension 1 months ago.  BP at that visit was 130/90. Management since that visit includes start HCTZ 12.5.  She reports excellent compliance with treatment. She is not having side effects.  She is following a Regular diet. She is exercising. She does smoke.  Use of agents associated with hypertension: HCTZ, started at last visit.   Outside blood pressures are not checked. Symptoms: No chest pain No chest pressure  No palpitations No syncope  No dyspnea No orthopnea  No paroxysmal nocturnal dyspnea No lower extremity edema   Pertinent labs Lab Results  Component Value Date   CHOL 209 (H) 03/31/2021   HDL 45 03/31/2021   LDLCALC 144 (H) 03/31/2021   TRIG 109 03/31/2021   CHOLHDL 4.6 (H) 03/31/2021   Lab Results  Component Value Date   NA 143 03/31/2021   K 4.3 03/31/2021   CREATININE 0.86 03/31/2021   EGFR 82 03/31/2021   GLUCOSE 91 03/31/2021   TSH 0.769 06/12/2019     The 10-year ASCVD risk score (Arnett DK, et al., 2019) is: 5.2%  ---------------------------------------------------------------------------------------------------   Medications: Outpatient Medications Prior to Visit   Medication Sig   cetirizine (ZYRTEC) 10 MG tablet Take 1 tablet (10 mg total) by mouth daily.   fluticasone (FLONASE) 50 MCG/ACT nasal spray instill 2 sprays into each nostril once daily   hydrochlorothiazide (HYDRODIURIL) 12.5 MG tablet Take 1 tablet (12.5 mg total) by mouth daily.   No facility-administered medications prior to visit.    Review of Systems     Objective    BP 112/86 (BP Location: Right Arm, Patient Position: Sitting, Cuff Size: Normal)   Pulse 82   Temp 98 F (36.7 C) (Oral)   Resp 16   Ht '5\' 6"'  (1.676 m)   Wt 163 lb 8 oz (74.2 kg)   SpO2 100%   BMI 26.39 kg/m    Physical Exam  Gen: well appearing, in NAD Card: RRR Lungs: CTAB Ext: WWP, no edema  No results found for any visits on 09/25/21.  Assessment & Plan     Problem List Items Addressed This Visit       Cardiovascular and Mediastinum   Primary hypertension - Primary    Doing well on current regimen, no changes made today. Obtaining labs.      Relevant Orders   Basic Metabolic Panel (BMET)     Other   Alcohol use disorder, mild, abuse    Recommended cutting back.      Tobacco abuse    Counseled on cessation.         Return in about  6 months (around 03/28/2022) for HTN.       Myles Gip, [place] [phone] (phone) [phone] (fax)  [place]

## 2021-09-25 NOTE — Assessment & Plan Note (Signed)
Doing well on current regimen, no changes made today. Obtaining labs.

## 2021-09-25 NOTE — Assessment & Plan Note (Signed)
Counseled on cessation 

## 2021-09-25 NOTE — Assessment & Plan Note (Signed)
Recommended cutting back.

## 2021-09-25 NOTE — Patient Instructions (Signed)
It was great to see you!  Our plans for today:  - No changes to your medications.  - We are checking some labs today, we will release these results to your MyChart.  Take care and seek immediate care sooner if you develop any concerns.   Dr. Ky Barban

## 2021-09-26 LAB — BASIC METABOLIC PANEL
BUN/Creatinine Ratio: 13 (ref 9–23)
BUN: 12 mg/dL (ref 6–24)
CO2: 21 mmol/L (ref 20–29)
Calcium: 9.7 mg/dL (ref 8.7–10.2)
Chloride: 107 mmol/L — ABNORMAL HIGH (ref 96–106)
Creatinine, Ser: 0.93 mg/dL (ref 0.57–1.00)
Glucose: 105 mg/dL — ABNORMAL HIGH (ref 70–99)
Potassium: 4.1 mmol/L (ref 3.5–5.2)
Sodium: 144 mmol/L (ref 134–144)
eGFR: 74 mL/min/{1.73_m2} (ref >59)

## 2021-10-06 ENCOUNTER — Encounter: Payer: Self-pay | Admitting: Physician Assistant

## 2021-10-06 ENCOUNTER — Ambulatory Visit: Payer: No Typology Code available for payment source | Admitting: Physician Assistant

## 2021-10-06 VITALS — BP 110/87 | HR 69 | Temp 98.2°F | Resp 16 | Wt 162.0 lb

## 2021-10-06 DIAGNOSIS — G8929 Other chronic pain: Secondary | ICD-10-CM

## 2021-10-06 DIAGNOSIS — M545 Low back pain, unspecified: Secondary | ICD-10-CM | POA: Diagnosis not present

## 2021-10-06 NOTE — Progress Notes (Unsigned)
I,Jana Elvie Palomo,acting as a Education administrator for [employer], [name].,have documented all relevant documentation on the behalf of [name], [name],as directed by  [employer], [name] while in the presence of [employer], [name].  Established patient visit   Patient: Kayla Alexander   DOB: May 27, 1970   51 y.o. Female  MRN: [id number] Visit Date: 10/06/2021  Today's healthcare provider: [name], [name]   Chief Complaint  Patient presents with   Back Pain  Back pain  Subjective    Back Pain This is a new problem. The current episode started 1 to 4 weeks ago. The problem occurs constantly. The problem is unchanged. The pain is present in the lumbar spine. The quality of the pain is described as aching. The pain does not radiate. The pain is at a severity of 8/10. The pain is severe. The pain is The same all the time. Exacerbated by: any position. Pertinent negatives include no abdominal pain, bladder incontinence, bowel incontinence, chest pain, dysuria, fever, headaches, leg pain, numbness, paresis, paresthesias, pelvic pain, perianal numbness, tingling, weakness or weight loss. She has tried nothing for the symptoms.  Reports degenerative disc disease but this pain is new.   BACK PAIN Duration: weeks Mechanism of injury: new exercises Location: midline, bilateral, and low back Onset: sudden Severity: 8/10 Quality: dull, aching, tender, tearing, and throbbing Frequency: every day Radiation: lower back Aggravating factors: sittin or standing Alleviating factors: did not try anything Status: stable Treatments attempted: none  Relief with NSAIDs?: no Nighttime pain:  yes  Paresthesias / decreased sensation:  no Bowel / bladder incontinence:  no Fevers:  no Dysuria / urinary frequency:  no Medications: Outpatient Medications Prior to Visit  Medication Sig   cetirizine (ZYRTEC) 10 MG tablet Take 1 tablet (10 mg total) by mouth daily.   fluticasone (FLONASE) 50 MCG/ACT nasal spray  instill 2 sprays into each nostril once daily   hydrochlorothiazide (HYDRODIURIL) 12.5 MG tablet Take 1 tablet (12.5 mg total) by mouth daily.   No facility-administered medications prior to visit.    Review of Systems  Constitutional:  Negative for fever and weight loss.  Cardiovascular:  Negative for chest pain.  Gastrointestinal:  Negative for abdominal pain and bowel incontinence.  Genitourinary:  Negative for bladder incontinence, dysuria and pelvic pain.  Musculoskeletal:  Positive for back pain.  Neurological:  Negative for tingling, weakness, numbness, headaches and paresthesias.    {Labs  Heme  Chem  Endocrine  Serology  Results Review (optional):23779}   Objective    BP 110/87 (BP Location: Right Arm, Patient Position: Sitting, Cuff Size: Normal)   Pulse 69   Temp 98.2 F (36.8 C) (Oral)   Resp 16   Wt 162 lb (73.5 kg)   SpO2 100%   BMI 26.15 kg/m  {Show previous vital signs (optional):23777}  Physical Exam Vitals reviewed.  Constitutional:      General: She is not in acute distress.    Appearance: Normal appearance. She is well-developed. She is not diaphoretic.  HENT:     Head: Normocephalic and atraumatic.  Eyes:     General: No scleral icterus.    Conjunctiva/sclera: Conjunctivae normal.  Neck:     Thyroid: No thyromegaly.  Cardiovascular:     Rate and Rhythm: Normal rate and regular rhythm.     Pulses: Normal pulses.     Heart sounds: Normal heart sounds. No murmur heard. Pulmonary:     Effort: Pulmonary effort is normal. No respiratory distress.  Breath sounds: Normal breath sounds. No wheezing, rhonchi or rales.  Musculoskeletal:     Cervical back: Neck supple.     Right lower leg: No edema.     Left lower leg: No edema.  Lymphadenopathy:     Cervical: No cervical adenopathy.  Skin:    General: Skin is warm and dry.     Findings: No rash.  Neurological:     Mental Status: She is alert and oriented to person, place, and time. Mental  status is at baseline.  Psychiatric:        Mood and Affect: Mood normal.        Behavior: Behavior normal.       No results found for any visits on 10/06/21.  Assessment & Plan     1. Chronic bilateral low back pain without sciatica Exacerbation after trying new exercise - Ambulatory referral to Physical Therapy   No follow-ups on file.  The patient was advised to call back or seek an in-person evaluation if the symptoms worsen or if the condition fails to improve as anticipated.  I discussed the assessment and treatment plan with the patient. The patient was provided an opportunity to ask questions and all were answered. The patient agreed with the plan and demonstrated an understanding of the instructions.  The entirety of the information documented in the History of Present Illness, Review of Systems and Physical Exam were personally obtained by me. Portions of this information were initially documented by the CMA and reviewed by me for thoroughness and accuracy.  Portions of this note were created using dictation software and may contain typographical errors.     [name], [name]  [facility] [phone] (phone) [phone] (fax)  [place]

## 2021-11-14 NOTE — Progress Notes (Signed)
I,Kayla Alexander,acting as a Education administrator for [employer], [name].,have documented all relevant documentation on the behalf of Kayla Speak, [name],as directed by  [employer], [name] while in the presence of [employer], [name].   Established patient visit   Patient: Kayla Alexander   DOB: [birth year]   51 y.o. Female  MRN: [id number] Visit Date: 11/15/2021  Today's healthcare provider: [name], [name]   Chief Complaint  Patient presents with   Abdominal Pain   Subjective    Abdominal Pain  She reports new onset abdominal pain. The most recent episode started a few months and is staying constant. The abdominal pain is located in the lower abdomen/pelvic. It is described as cramping, is 6/10 in intensity, occurring every few  hours. It is aggravated by nothing and is relieved by nothing. She has tried nothing to try to relieve pain.  Associated symptoms:  No anorexia  No belching  No bloody stool No blood in urine   No constipation No diarrhea  No dysuria No fever  No flatus No headaches  No headaches No joint pains  No myalgias No nausea  No vomiting No weight loss     Recent GI studies:   States it feels like she getting ready to start her period. 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  Also reports low back pain while she feels is related.        Has no hx of kidney stone, kidney  Or bladder infection  Previous labs Lab Results  Component Value Date   WBC 5.5 03/17/2019   HGB 14.2 03/17/2019   HCT 40.5 03/17/2019   MCV 94 03/17/2019   MCH 33.1 (H) 03/17/2019   RDW 12.2 03/17/2019   PLT 246 03/17/2019   Lab Results  Component Value Date   GLUCOSE 105 (H) 09/25/2021   NA 144 09/25/2021   K 4.1 09/25/2021   CL 107 (H) 09/25/2021   CO2 21 09/25/2021   BUN 12 09/25/2021   CREATININE 0.93 09/25/2021   GFRNONAA 84 03/31/2020   GFRAA 97 03/31/2020   CALCIUM 9.7 09/25/2021   PROT 6.8 03/31/2021   ALBUMIN 4.4 03/31/2021   LABGLOB 2.4 03/31/2021    AGRATIO 1.8 03/31/2021   BILITOT 0.5 03/31/2021   ALKPHOS 76 03/31/2021   AST 18 03/31/2021   ALT 17 03/31/2021   ANIONGAP 8 11/03/2017   No results found for: "AMYLASE" -----------------------------------------------------------------------------------------   Medications: Outpatient Medications Prior to Visit  Medication Sig   cetirizine (ZYRTEC) 10 MG tablet Take 1 tablet (10 mg total) by mouth daily.   fluticasone (FLONASE) 50 MCG/ACT nasal spray instill 2 sprays into each nostril once daily   hydrochlorothiazide (HYDRODIURIL) 12.5 MG tablet Take 1 tablet (12.5 mg total) by mouth daily.   No facility-administered medications prior to visit.    Review of Systems  All other systems reviewed and are negative. Except see hpi     Objective    BP 108/89 (BP Location: Left Arm, Patient Position: Sitting, Cuff Size: Normal)   Pulse 79   Temp 98.3 F (36.8 C) (Oral)   Resp 16   Wt 164 lb (74.4 kg)   SpO2 100%   BMI 26.47 kg/m    Physical Exam Vitals reviewed.  Constitutional:      General: She is not in acute distress.    Appearance: Normal appearance. She is well-developed. She is not diaphoretic.  HENT:     Head: Normocephalic and atraumatic.  Eyes:  General: No scleral icterus.    Conjunctiva/sclera: Conjunctivae normal.  Neck:     Thyroid: No thyromegaly.  Cardiovascular:     Rate and Rhythm: Normal rate and regular rhythm.     Pulses: Normal pulses.     Heart sounds: Normal heart sounds. No murmur heard. Pulmonary:     Effort: Pulmonary effort is normal. No respiratory distress.     Breath sounds: Normal breath sounds. No wheezing, rhonchi or rales.  Abdominal:     Tenderness: There is abdominal tenderness (generalized).  Musculoskeletal:     Cervical back: Neck supple.     Right lower leg: No edema.     Left lower leg: No edema.  Lymphadenopathy:     Cervical: No cervical adenopathy.  Skin:    General: Skin is warm and dry.     Findings: No rash.   Neurological:     Mental Status: She is alert and oriented to person, place, and time. Mental status is at baseline.  Psychiatric:        Mood and Affect: Mood normal.        Behavior: Behavior normal.       No results found for any visits on 11/15/21.  Assessment & Plan     1. Lower abdominal pain Chronic. Unclear etiology Could be due to gi, gyn or urologic problems Needs to see ObGYn for evaluation as she was last seen by ObGyn 10 years ago.  Results of cologuard from 04/08/21 were negative - POCT Urinalysis Dipstick - Ambulatory referral to Obstetrics / Gynecology - Comprehensive Metabolic Panel (CMET) - Urinalysis, Routine w reflex microscopic Might need a referral to GI  Advised a trial of low fodmap diet. Pt instructions for low fodmap diet provided.  Restrictions with alcohol was encouraged. Tobacco cessation was advised.  2. Fear of side effects of medication Currently, on HCTZ, worried that it could cause some Gi side effects that she has been experiencing recently  3. Hematuria If symptoms persist after possible treatment for UTI, might need a referral to Urology.   FU PRN/ in a mo?    The patient was advised to call back or seek an in-person evaluation if the symptoms worsen or if the condition fails to improve as anticipated.  I discussed the assessment and treatment plan with the patient. The patient was provided an opportunity to ask questions and all were answered. The patient agreed with the plan and demonstrated an understanding of the instructions.  The entirety of the information documented in the History of Present Illness, Review of Systems and Physical Exam were personally obtained by me. Portions of this information were initially documented by the CMA and reviewed by me for thoroughness and accuracy.  Portions of this note were created using dictation software and may contain typographical errors.     Kayla Speak, [name]  [facility] [phone] (phone) [phone] (fax)  [place]

## 2021-11-15 ENCOUNTER — Encounter: Payer: Self-pay | Admitting: Physician Assistant

## 2021-11-15 ENCOUNTER — Ambulatory Visit: Payer: No Typology Code available for payment source | Admitting: Physician Assistant

## 2021-11-15 VITALS — BP 108/89 | HR 79 | Temp 98.3°F | Resp 16 | Wt 164.0 lb

## 2021-11-15 DIAGNOSIS — F1010 Alcohol abuse, uncomplicated: Secondary | ICD-10-CM

## 2021-11-15 DIAGNOSIS — Z720 Tobacco use: Secondary | ICD-10-CM | POA: Diagnosis not present

## 2021-11-15 DIAGNOSIS — R319 Hematuria, unspecified: Secondary | ICD-10-CM

## 2021-11-15 DIAGNOSIS — R102 Pelvic and perineal pain: Secondary | ICD-10-CM | POA: Diagnosis not present

## 2021-11-15 DIAGNOSIS — R1030 Lower abdominal pain, unspecified: Secondary | ICD-10-CM | POA: Diagnosis not present

## 2021-11-15 DIAGNOSIS — F40298 Other specified phobia: Secondary | ICD-10-CM | POA: Diagnosis not present

## 2021-11-15 LAB — POCT URINALYSIS DIPSTICK
Bilirubin, UA: NEGATIVE
Glucose, UA: NEGATIVE
Ketones, UA: NEGATIVE
Leukocytes, UA: NEGATIVE
Nitrite, UA: NEGATIVE
Protein, UA: NEGATIVE
Spec Grav, UA: 1.015 (ref 1.010–1.025)
Urobilinogen, UA: 1 E.U./dL
pH, UA: 6 (ref 5.0–8.0)

## 2021-11-16 LAB — COMPREHENSIVE METABOLIC PANEL
ALT: 16 IU/L (ref 0–32)
AST: 19 IU/L (ref 0–40)
Albumin/Globulin Ratio: 1.9 (ref 1.2–2.2)
Albumin: 4.3 g/dL (ref 3.8–4.9)
Alkaline Phosphatase: 73 IU/L (ref 44–121)
BUN/Creatinine Ratio: 10 (ref 9–23)
BUN: 9 mg/dL (ref 6–24)
Bilirubin Total: 0.5 mg/dL (ref 0.0–1.2)
CO2: 22 mmol/L (ref 20–29)
Calcium: 9 mg/dL (ref 8.7–10.2)
Chloride: 101 mmol/L (ref 96–106)
Creatinine, Ser: 0.86 mg/dL (ref 0.57–1.00)
Globulin, Total: 2.3 g/dL (ref 1.5–4.5)
Glucose: 90 mg/dL (ref 70–99)
Potassium: 4.3 mmol/L (ref 3.5–5.2)
Sodium: 137 mmol/L (ref 134–144)
Total Protein: 6.6 g/dL (ref 6.0–8.5)
eGFR: 82 mL/min/{1.73_m2} (ref >59)

## 2021-11-24 ENCOUNTER — Ambulatory Visit: Payer: No Typology Code available for payment source | Admitting: Family Medicine

## 2021-11-24 ENCOUNTER — Encounter: Payer: Self-pay | Admitting: Family Medicine

## 2021-11-24 VITALS — BP 108/88 | HR 77 | Temp 97.8°F | Resp 16 | Ht 66.0 in | Wt 164.0 lb

## 2021-11-24 DIAGNOSIS — E782 Mixed hyperlipidemia: Secondary | ICD-10-CM

## 2021-11-24 DIAGNOSIS — Z83438 Family history of other disorder of lipoprotein metabolism and other lipidemia: Secondary | ICD-10-CM

## 2021-11-24 DIAGNOSIS — Z823 Family history of stroke: Secondary | ICD-10-CM | POA: Diagnosis not present

## 2021-11-24 DIAGNOSIS — Z8249 Family history of ischemic heart disease and other diseases of the circulatory system: Secondary | ICD-10-CM

## 2021-11-24 MED ORDER — HYDROCHLOROTHIAZIDE 12.5 MG PO TABS: 12.5000 mg | ORAL_TABLET | Freq: Every day | ORAL | 3 refills | Status: DC

## 2021-11-24 MED ORDER — ROSUVASTATIN CALCIUM 10 MG PO TABS: 10.0000 mg | ORAL_TABLET | Freq: Every day | ORAL | 3 refills | Status: DC

## 2021-11-24 NOTE — Assessment & Plan Note (Signed)
Family hx of MI

## 2021-11-24 NOTE — Patient Instructions (Signed)
The 10-year ASCVD risk score (Arnett DK, et al., 2019) is: 4.8%   Values used to calculate the score:     Age: 51 years     Sex: Female     Is Non-Hispanic African American: No     Diabetic: No     Tobacco smoker: Yes     Systolic Blood Pressure: 718 mmHg     Is BP treated: Yes     HDL Cholesterol: 45 mg/dL     Total Cholesterol: 209 mg/dL Heart attack and stroke risk is 5% estimated within the next 10 years which is low-moderate.

## 2021-11-24 NOTE — Assessment & Plan Note (Signed)
Mom with hx of HLD

## 2021-11-24 NOTE — Assessment & Plan Note (Signed)
Mom noted with debilitating stroke

## 2021-11-24 NOTE — Assessment & Plan Note (Signed)
Mom with hx of carotid artery stenosis

## 2021-11-24 NOTE — Assessment & Plan Note (Signed)
Chronic, previously elevated with LDL of 144 Strong familial history; repeat LP Wishes to start cholesterol medication to assist in risk reduction

## 2021-11-24 NOTE — Progress Notes (Signed)
Established patient visit   Patient: Kayla Alexander   DOB: Aug 28, 1970   51 y.o. Female  MRN: [id number] Visit Date: 11/24/2021  Today's healthcare provider: Gwyneth Sprout, FNP  Patient presents for new patient visit to establish care.  Introduced to Designer, jewellery role and practice setting.  All questions answered.  Discussed provider/patient relationship and expectations.   I,Tiffany J Bragg,acting as a scribe for Gwyneth Sprout, FNP.,have documented all relevant documentation on the behalf of Gwyneth Sprout, FNP,as directed by  Gwyneth Sprout, FNP while in the presence of Gwyneth Sprout, FNP.   Chief Complaint  Patient presents with   Hypertension   Subjective    HPI  Hypertension, follow-up  BP Readings from Last 3 Encounters:  11/24/21 108/88  11/15/21 108/89  10/06/21 110/87   Wt Readings from Last 3 Encounters:  11/24/21 164 lb (74.4 kg)  11/15/21 164 lb (74.4 kg)  10/06/21 162 lb (73.5 kg)     She was last seen for hypertension 2 months ago.  BP at that visit was 112/86. Management since that visit includes continue medication. Patient complains of severe bloating and back pain after being on HCTZ. Says she stopped taking it a week ago and wants to look into other meds for HTN   Outside blood pressures are not checked. Symptoms: No chest pain No chest pressure  No palpitations No syncope  No dyspnea No orthopnea  No paroxysmal nocturnal dyspnea No lower extremity edema   Pertinent labs Lab Results  Component Value Date   CHOL 209 (H) 03/31/2021   HDL 45 03/31/2021   LDLCALC 144 (H) 03/31/2021   TRIG 109 03/31/2021   CHOLHDL 4.6 (H) 03/31/2021   Lab Results  Component Value Date   NA 137 11/15/2021   K 4.3 11/15/2021   CREATININE 0.86 11/15/2021   EGFR 82 11/15/2021   GLUCOSE 90 11/15/2021   TSH 0.769 06/12/2019     The 10-year ASCVD risk score (Arnett DK, et al., 2019) is:  4.8%  ---------------------------------------------------------------------------------------------------   Medications: Outpatient Medications Prior to Visit  Medication Sig   cetirizine (ZYRTEC) 10 MG tablet Take 1 tablet (10 mg total) by mouth daily.   fluticasone (FLONASE) 50 MCG/ACT nasal spray instill 2 sprays into each nostril once daily   [DISCONTINUED] hydrochlorothiazide (HYDRODIURIL) 12.5 MG tablet Take 1 tablet (12.5 mg total) by mouth daily. (Patient not taking: Reported on 11/24/2021)   No facility-administered medications prior to visit.    Review of Systems  Last CBC Lab Results  Component Value Date   WBC 5.5 03/17/2019   HGB 14.2 03/17/2019   HCT 40.5 03/17/2019   MCV 94 03/17/2019   MCH 33.1 (H) 03/17/2019   RDW 12.2 03/17/2019   PLT 246 16/94/5038   Last metabolic panel Lab Results  Component Value Date   GLUCOSE 90 11/15/2021   NA 137 11/15/2021   K 4.3 11/15/2021   CL 101 11/15/2021   CO2 22 11/15/2021   BUN 9 11/15/2021   CREATININE 0.86 11/15/2021   EGFR 82 11/15/2021   CALCIUM 9.0 11/15/2021   PROT 6.6 11/15/2021   ALBUMIN 4.3 11/15/2021   LABGLOB 2.3 11/15/2021   AGRATIO 1.9 11/15/2021   BILITOT 0.5 11/15/2021   ALKPHOS 73 11/15/2021   AST 19 11/15/2021   ALT 16 11/15/2021   ANIONGAP 8 11/03/2017   Last lipids Lab Results  Component Value Date   CHOL 209 (H) 03/31/2021   HDL 45 03/31/2021  LDLCALC 144 (H) 03/31/2021   TRIG 109 03/31/2021   CHOLHDL 4.6 (H) 03/31/2021   Last thyroid functions Lab Results  Component Value Date   TSH 0.769 06/12/2019   Last vitamin D Lab Results  Component Value Date   VD25OH 44 10/17/2015   Last vitamin B12 and Folate Lab Results  Component Value Date   VITAMINB12 237 10/17/2015       Objective    BP 108/88 (BP Location: Right Arm, Patient Position: Sitting, Cuff Size: Normal)   Pulse 77   Temp 97.8 F (36.6 C) (Oral)   Resp 16   Ht '5\' 6"'  (1.676 m)   Wt 164 lb (74.4 kg)   SpO2  98%   BMI 26.47 kg/m   BP Readings from Last 3 Encounters:  11/24/21 108/88  11/15/21 108/89  10/06/21 110/87   Wt Readings from Last 3 Encounters:  11/24/21 164 lb (74.4 kg)  11/15/21 164 lb (74.4 kg)  10/06/21 162 lb (73.5 kg)   SpO2 Readings from Last 3 Encounters:  11/24/21 98%  11/15/21 100%  10/06/21 100%   Physical Exam Vitals and nursing note reviewed.  Constitutional:      General: She is not in acute distress.    Appearance: Normal appearance. She is overweight. She is not ill-appearing, toxic-appearing or diaphoretic.  HENT:     Head: Normocephalic and atraumatic.  Cardiovascular:     Rate and Rhythm: Normal rate and regular rhythm.     Pulses: Normal pulses.     Heart sounds: Normal heart sounds. No murmur heard.    No friction rub. No gallop.  Pulmonary:     Effort: Pulmonary effort is normal. No respiratory distress.     Breath sounds: Normal breath sounds. No stridor. No wheezing, rhonchi or rales.  Chest:     Chest wall: No tenderness.  Musculoskeletal:        General: No swelling, tenderness, deformity or signs of injury. Normal range of motion.     Right lower leg: No edema.     Left lower leg: No edema.     Comments: Complaints of back pain with use of HCTZ daily  Skin:    General: Skin is warm and dry.     Capillary Refill: Capillary refill takes less than 2 seconds.     Coloration: Skin is not jaundiced or pale.     Findings: No bruising, erythema, lesion or rash.  Neurological:     General: No focal deficit present.     Mental Status: She is alert and oriented to person, place, and time. Mental status is at baseline.     Cranial Nerves: No cranial nerve deficit.     Sensory: No sensory deficit.     Motor: No weakness.     Coordination: Coordination normal.  Psychiatric:        Mood and Affect: Mood normal.        Behavior: Behavior normal.        Thought Content: Thought content normal.        Judgment: Judgment normal.     No results  found for any visits on 11/24/21.  Assessment & Plan     Problem List Items Addressed This Visit       Other   Family history of carotid artery stenosis    Mom with hx of carotid artery stenosis       Relevant Medications   rosuvastatin (CRESTOR) 10 MG tablet   Other Relevant Orders  Lipid panel   CT CARDIAC SCORING (SELF PAY ONLY)   Family history of hyperlipidemia    Mom with hx of HLD       Relevant Medications   rosuvastatin (CRESTOR) 10 MG tablet   Other Relevant Orders   Lipid panel   CT CARDIAC SCORING (SELF PAY ONLY)   Family history of MI (myocardial infarction)    Family hx of MI      Relevant Medications   rosuvastatin (CRESTOR) 10 MG tablet   hydrochlorothiazide (HYDRODIURIL) 12.5 MG tablet   Other Relevant Orders   Lipid panel   CT CARDIAC SCORING (SELF PAY ONLY)   Family history of stroke    Mom noted with debilitating stroke       Relevant Medications   rosuvastatin (CRESTOR) 10 MG tablet   hydrochlorothiazide (HYDRODIURIL) 12.5 MG tablet   Other Relevant Orders   Lipid panel   CT CARDIAC SCORING (SELF PAY ONLY)   Mixed hyperlipidemia - Primary    Chronic, previously elevated with LDL of 144 Strong familial history; repeat LP Wishes to start cholesterol medication to assist in risk reduction       Relevant Medications   rosuvastatin (CRESTOR) 10 MG tablet   hydrochlorothiazide (HYDRODIURIL) 12.5 MG tablet   Other Relevant Orders   Lipid panel   CT CARDIAC SCORING (SELF PAY ONLY)   Return in about 6 months (around 05/25/2022).     Vonna Kotyk, FNP, have reviewed all documentation for this visit. The documentation on 11/24/21 for the exam, diagnosis, procedures, and orders are all accurate and complete.  Gwyneth Sprout, [place] [phone] (phone) [phone] (fax)  [place]

## 2021-11-25 LAB — LIPID PANEL
Chol/HDL Ratio: 4.9 ratio — ABNORMAL HIGH (ref 0.0–4.4)
Cholesterol, Total: 214 mg/dL — ABNORMAL HIGH (ref 100–199)
HDL: 44 mg/dL (ref >39)
LDL Chol Calc (NIH): 146 mg/dL — ABNORMAL HIGH (ref 0–99)
Triglycerides: 131 mg/dL (ref 0–149)
VLDL Cholesterol Cal: 24 mg/dL (ref 5–40)

## 2021-11-25 NOTE — Progress Notes (Signed)
Hi Esmerelda,  Cholesterol is relatively unchanged from labs earlier this year; slight elevation remains in total and LDL/bad cholesterol is roughly 50 pts above goal. Plan to repeat in 3-6 months after starting statin.  Take care,  Gwyneth Sprout, [address] [place], [place] [address] [phone] (phone) [phone] (fax) [place]

## 2021-11-28 ENCOUNTER — Ambulatory Visit
Admission: RE | Admit: 2021-11-28 | Discharge: 2021-11-28 | Disposition: A | Discharge status: Home / Self Care | Payer: No Typology Code available for payment source | Source: Ambulatory Visit | Attending: Family Medicine | Admitting: Family Medicine

## 2021-11-28 DIAGNOSIS — Z823 Family history of stroke: Secondary | ICD-10-CM | POA: Insufficient documentation

## 2021-11-28 DIAGNOSIS — Z83438 Family history of other disorder of lipoprotein metabolism and other lipidemia: Secondary | ICD-10-CM | POA: Insufficient documentation

## 2021-11-28 DIAGNOSIS — Z8249 Family history of ischemic heart disease and other diseases of the circulatory system: Secondary | ICD-10-CM | POA: Insufficient documentation

## 2021-11-28 DIAGNOSIS — E782 Mixed hyperlipidemia: Secondary | ICD-10-CM | POA: Insufficient documentation

## 2021-11-28 NOTE — Progress Notes (Unsigned)
Argentina Ponder [place],acting as a scribe for Gwyneth Sprout, FNP.,have documented all relevant documentation on the behalf of Gwyneth Sprout, FNP,as directed by  Gwyneth Sprout, FNP while in the presence of Gwyneth Sprout, FNP.     Established patient visit   Patient: Kayla Alexander   DOB: 1970/10/18   51 y.o. Female  MRN: [id number] Visit Date: 11/29/2021  Today's healthcare provider: Gwyneth Sprout, FNP  Introduced to nurse practitioner role and practice setting.  All questions answered.  Discussed provider/patient relationship and expectations.  Subjective    HPI  Patient is a 51 year old female who presents today for her pap smear.  Patient states she has not had a period in over 10 years.  She has recently had pelvic cramping and back pain.  She denies any discharge and does not believe she has ever had any abnormal pap smears in the past.   She does have history of D & C many years ago.  Medications: Outpatient Medications Prior to Visit  Medication Sig   cetirizine (ZYRTEC) 10 MG tablet Take 1 tablet (10 mg total) by mouth daily.   fluticasone (FLONASE) 50 MCG/ACT nasal spray instill 2 sprays into each nostril once daily   hydrochlorothiazide (HYDRODIURIL) 12.5 MG tablet Take 1 tablet (12.5 mg total) by mouth daily.   rosuvastatin (CRESTOR) 10 MG tablet Take 1 tablet (10 mg total) by mouth daily.   No facility-administered medications prior to visit.    Review of Systems  Genitourinary:  Negative for pelvic pain, vaginal bleeding and vaginal discharge.  Musculoskeletal:  Positive for back pain.     Objective    BP 110/84 (BP Location: Right Arm, Patient Position: Sitting, Cuff Size: Normal)   Pulse 68   Temp 97.8 F (36.6 C) (Oral)   Wt 164 lb (74.4 kg)   SpO2 98%   BMI 26.47 kg/m    Physical Exam Exam conducted with a chaperone present.  Abdominal:     Hernia: There is no hernia in the left inguinal area or right inguinal area.  Genitourinary:    General: Normal  vulva.     Exam position: Lithotomy position.     Pubic Area: No rash or pubic lice.      Tanner stage (genital): 5.     Labia:        Right: No rash, tenderness, lesion or injury.        Left: No rash, tenderness, lesion or injury.      Urethra: No prolapse, urethral pain, urethral swelling or urethral lesion.     Vagina: Normal.     Cervix: Normal and dilated.     Uterus: Normal.      Adnexa: Right adnexa normal and left adnexa normal.     Comments: PAP completed with HPV co-testing  Lymphadenopathy:     Lower Body: No right inguinal adenopathy. No left inguinal adenopathy.      No results found for any visits on 11/29/21.  Assessment & Plan     Problem List Items Addressed This Visit       Other   Cervical cancer screening - Primary    Due for PAP Reports almost daily cramping/back pain; denies dysuria Denies discharge; consented for routine STI check       Relevant Orders   Cytology - PAP     Return in about 6 months (around 05/30/2022) for chonic disease management.      IGwyneth Sprout, FNP,  have reviewed all documentation for this visit. The documentation on 11/29/21 for the exam, diagnosis, procedures, and orders are all accurate and complete.    Gwyneth Sprout, [place] [phone] (phone) [phone] (fax)  [place]

## 2021-11-29 ENCOUNTER — Encounter: Payer: Self-pay | Admitting: Family Medicine

## 2021-11-29 ENCOUNTER — Ambulatory Visit: Payer: No Typology Code available for payment source | Admitting: Family Medicine

## 2021-11-29 ENCOUNTER — Other Ambulatory Visit (HOSPITAL_COMMUNITY)
Admission: RE | Admit: 2021-11-29 | Discharge: 2021-11-29 | Disposition: A | Discharge status: Home / Self Care | Payer: No Typology Code available for payment source | Source: Ambulatory Visit | Attending: Family Medicine | Admitting: Family Medicine

## 2021-11-29 VITALS — BP 110/84 | HR 68 | Temp 97.8°F | Wt 164.0 lb

## 2021-11-29 DIAGNOSIS — Z124 Encounter for screening for malignant neoplasm of cervix: Secondary | ICD-10-CM | POA: Insufficient documentation

## 2021-11-29 NOTE — Progress Notes (Signed)
Very reassuring CT scan results; no current blockage noted.  Kayla Alexander, [address] [place], [place] [address] [phone] (phone) [phone] (fax) [place]

## 2021-11-29 NOTE — Assessment & Plan Note (Signed)
Due for PAP Reports almost daily cramping/back pain; denies dysuria Denies discharge; consented for routine STI check

## 2021-12-04 ENCOUNTER — Ambulatory Visit: Payer: No Typology Code available for payment source | Admitting: Family Medicine

## 2021-12-05 LAB — CYTOLOGY - PAP
Chlamydia: NEGATIVE
Comment: NEGATIVE
Comment: NEGATIVE
Comment: NEGATIVE
Comment: NEGATIVE
Comment: NORMAL
Diagnosis: NEGATIVE
HSV1: NEGATIVE
HSV2: NEGATIVE
High risk HPV: NEGATIVE
Neisseria Gonorrhea: NEGATIVE
Trichomonas: NEGATIVE

## 2021-12-06 NOTE — Progress Notes (Signed)
Normal/negative PAP; good sampling of cells. Negative STIs. Repeat in 5 years with HPV co-testing.  Gwyneth Sprout, [address] [place], [place] [address] [phone] (phone) [phone] (fax) [place]

## 2021-12-24 ENCOUNTER — Other Ambulatory Visit: Payer: Self-pay | Admitting: Family Medicine

## 2021-12-24 DIAGNOSIS — Z823 Family history of stroke: Secondary | ICD-10-CM

## 2021-12-24 DIAGNOSIS — Z8249 Family history of ischemic heart disease and other diseases of the circulatory system: Secondary | ICD-10-CM

## 2022-02-19 ENCOUNTER — Ambulatory Visit: Payer: No Typology Code available for payment source | Admitting: Family Medicine

## 2022-04-05 ENCOUNTER — Encounter: Payer: Self-pay | Admitting: Family Medicine

## 2022-04-05 ENCOUNTER — Encounter: Payer: No Typology Code available for payment source | Admitting: Family Medicine

## 2022-04-05 ENCOUNTER — Ambulatory Visit (INDEPENDENT_AMBULATORY_CARE_PROVIDER_SITE_OTHER): Payer: No Typology Code available for payment source | Admitting: Family Medicine

## 2022-04-05 VITALS — BP 114/76 | HR 67 | Ht 66.0 in | Wt 166.0 lb

## 2022-04-05 DIAGNOSIS — R739 Hyperglycemia, unspecified: Secondary | ICD-10-CM | POA: Insufficient documentation

## 2022-04-05 DIAGNOSIS — Z0000 Encounter for general adult medical examination without abnormal findings: Secondary | ICD-10-CM | POA: Diagnosis not present

## 2022-04-05 DIAGNOSIS — E7800 Pure hypercholesterolemia, unspecified: Secondary | ICD-10-CM | POA: Diagnosis not present

## 2022-04-05 DIAGNOSIS — N951 Menopausal and female climacteric states: Secondary | ICD-10-CM | POA: Diagnosis not present

## 2022-04-05 DIAGNOSIS — F17200 Nicotine dependence, unspecified, uncomplicated: Secondary | ICD-10-CM | POA: Diagnosis not present

## 2022-04-05 DIAGNOSIS — Z5320 Procedure and treatment not carried out because of patient's decision for unspecified reasons: Secondary | ICD-10-CM | POA: Insufficient documentation

## 2022-04-05 DIAGNOSIS — E782 Mixed hyperlipidemia: Secondary | ICD-10-CM

## 2022-04-05 NOTE — Assessment & Plan Note (Signed)
Goal <100 with strong family hx, o/w Body mass index is 26.79 kg/m., tobacco use, ETOH use weekly

## 2022-04-05 NOTE — Assessment & Plan Note (Signed)
UTD on dental Working on tobacco and ETOH Staying active at gym No longer having elevated Bps; stopped low dose HCTZ Declines mammo UTD on Pap and Cologuard Things to do to keep yourself healthy  - Exercise at least 30-45 minutes a day, 3-4 days a week.  - Eat a low-fat diet with lots of fruits and vegetables, up to 7-9 servings per day.  - Seatbelts can save your life. Wear them always.  - Smoke detectors on every level of your home, check batteries every year.  - Eye Doctor - have an eye exam every 1-2 years  - Safe sex - if you may be exposed to STDs, use a condom.  - Alcohol -  If you drink, do it moderately, less than 2 drinks per day.  - [place]. Choose someone to speak for you if you are not able.  - Depression is common in our stressful world.If you're feeling down or losing interest in things you normally enjoy, please come in for a visit.  - Violence - If anyone is threatening or hurting you, please call immediately.

## 2022-04-05 NOTE — Patient Instructions (Signed)
The CDC recommends two doses of Shingrix (the new shingles vaccine) separated by 2 to 6 months for adults age 52 years and older. I recommend checking with your insurance plan regarding coverage for this vaccine.    

## 2022-04-05 NOTE — Assessment & Plan Note (Signed)
Chronic, improving Repeat hormones to check progress of menopause  Has modified diet to assist

## 2022-04-05 NOTE — Assessment & Plan Note (Signed)
Chronic, now on statin Repeat LP  Previously LDL at 146 Denies complaints with Crestor at 10 mg

## 2022-04-05 NOTE — Assessment & Plan Note (Signed)
Working on reduction; staying active at gym 4x/wk on lunch hour

## 2022-04-05 NOTE — Progress Notes (Signed)
Date:  04/05/2022   Name:  Kayla Alexander   DOB:  08/28/1970   MRN:  [id number]   Kayla Alexander is a 52 y.o. female who presents today for her Complete Annual Exam. She feels well. She reports exercising workouts 4 times a week. She reports she is sleeping well. Breast complaints none.  Mammogram: 03/29/2020; declines repeat imaging at this time Pap smear: 11/29/2021 Colo guard: 03/2021  Health Maintenance Due  Topic Date Due   DTaP/Tdap/Td (1 - Tdap) Never done   Zoster Vaccines- Shingrix (1 of 2) Never done    Immunization History  Administered Date(s) Administered   PFIZER(Purple Top)SARS-COV-2 Vaccination 12/07/2019, 12/28/2019    Chief Complaint: Annual Exam  HPI  Lab Results  Component Value Date   NA 137 11/15/2021   K 4.3 11/15/2021   CO2 22 11/15/2021   GLUCOSE 90 11/15/2021   BUN 9 11/15/2021   CREATININE 0.86 11/15/2021   CALCIUM 9.0 11/15/2021   EGFR 82 11/15/2021   GFRNONAA 84 03/31/2020   Lab Results  Component Value Date   CHOL 214 (H) 11/24/2021   HDL 44 11/24/2021   LDLCALC 146 (H) 11/24/2021   TRIG 131 11/24/2021   CHOLHDL 4.9 (H) 11/24/2021   Lab Results  Component Value Date   TSH 0.769 06/12/2019   No results found for: "HGBA1C" Lab Results  Component Value Date   WBC 5.5 03/17/2019   HGB 14.2 03/17/2019   HCT 40.5 03/17/2019   MCV 94 03/17/2019   PLT 246 03/17/2019   Lab Results  Component Value Date   ALT 16 11/15/2021   AST 19 11/15/2021   ALKPHOS 73 11/15/2021   BILITOT 0.5 11/15/2021   Lab Results  Component Value Date   VD25OH 44 10/17/2015     Review of Systems  Patient Active Problem List   Diagnosis Date Noted   Annual physical exam 04/05/2022   Vasomotor symptoms due to menopause 04/05/2022   Elevated LDL cholesterol level 04/05/2022   Tobacco dependence 04/05/2022   Mammogram declined 04/05/2022   Elevated serum glucose 04/05/2022   Family history of hyperlipidemia 11/24/2021   Family history of stroke  11/24/2021   Family history of carotid artery stenosis 11/24/2021   Family history of MI (myocardial infarction) 11/24/2021   Mixed hyperlipidemia 11/24/2021    No Known Allergies  Past Surgical History:  Procedure Laterality Date   ENDOMETRIAL ABLATION Bilateral 2011    Social History   Tobacco Use   Smoking status: Every Day    Packs/day: 0.25    Types: Cigarettes   Smokeless tobacco: Former   Tobacco comments:    Working on cutting tobacco   Vaping Use   Vaping Use: Never used  Substance Use Topics   Alcohol use: Yes    Alcohol/week: 6.0 standard drinks of alcohol    Types: 6 Cans of beer per week    Comment: 3 beers Friday and Saturday nights   Drug use: No     Medication list has been reviewed and updated.  Current Meds  Medication Sig   rosuvastatin (CRESTOR) 10 MG tablet Take 1 tablet (10 mg total) by mouth daily.   [DISCONTINUED] cetirizine (ZYRTEC) 10 MG tablet Take 1 tablet (10 mg total) by mouth daily.   [DISCONTINUED] fluticasone (FLONASE) 50 MCG/ACT nasal spray instill 2 sprays into each nostril once daily   [DISCONTINUED] hydrochlorothiazide (HYDRODIURIL) 12.5 MG tablet TAKE 1 TABLET(12.5 MG) BY MOUTH DAILY       10/17/2015  4:05 PM  GAD 7 : Generalized Anxiety Score  Nervous, Anxious, on Edge 1  Control/stop worrying 1  Worry too much - different things 2  Trouble relaxing 3  Restless 1  Easily annoyed or irritable 2  Afraid - awful might happen 1  Total GAD 7 Score 11  Anxiety Difficulty Not difficult at all       04/05/2022    8:51 AM 11/24/2021    3:05 PM 08/29/2021    8:26 AM  Depression screen PHQ 2/9  Decreased Interest 0 0 0  Down, Depressed, Hopeless 0 0 0  PHQ - 2 Score 0 0 0  Altered sleeping 0 0 1  Tired, decreased energy 0 0 1  Change in appetite 0 0 1  Feeling bad or failure about yourself  0 0 0  Trouble concentrating 0 0 1  Moving slowly or fidgety/restless 0 0 0  Suicidal thoughts 0 0 0  PHQ-9 Score 0 0 4  Difficult  doing work/chores  Not difficult at all Not difficult at all    BP Readings from Last 3 Encounters:  04/05/22 114/76  11/29/21 110/84  11/24/21 108/88    Physical Exam Vitals and nursing note reviewed.  Constitutional:      General: She is awake. She is not in acute distress.    Appearance: Normal appearance. She is well-developed, well-groomed and overweight. She is not ill-appearing, toxic-appearing or diaphoretic.  HENT:     Head: Normocephalic and atraumatic.     Jaw: There is normal jaw occlusion. No trismus, tenderness, swelling or pain on movement.     Right Ear: Hearing, tympanic membrane, ear canal and external ear normal. There is no impacted cerumen.     Left Ear: Hearing, tympanic membrane, ear canal and external ear normal. There is no impacted cerumen.     Nose: Nose normal. No congestion or rhinorrhea.     Right Turbinates: Not enlarged, swollen or pale.     Left Turbinates: Not enlarged, swollen or pale.     Right Sinus: No maxillary sinus tenderness or frontal sinus tenderness.     Left Sinus: No maxillary sinus tenderness or frontal sinus tenderness.     Mouth/Throat:     Lips: Pink.     Mouth: Mucous membranes are moist. No injury.     Tongue: No lesions.     Pharynx: Oropharynx is clear. Uvula midline. No pharyngeal swelling, oropharyngeal exudate, posterior oropharyngeal erythema or uvula swelling.     Tonsils: No tonsillar exudate or tonsillar abscesses.  Eyes:     General: Lids are normal. Lids are everted, no foreign bodies appreciated. Vision grossly intact. Gaze aligned appropriately. No allergic shiner or visual field deficit.       Right eye: No discharge.        Left eye: No discharge.     Extraocular Movements: Extraocular movements intact.     Conjunctiva/sclera: Conjunctivae normal.     Right eye: Right conjunctiva is not injected. No exudate.    Left eye: Left conjunctiva is not injected. No exudate.    Pupils: Pupils are equal, round, and  reactive to light.  Neck:     Thyroid: No thyroid mass, thyromegaly or thyroid tenderness.     Vascular: No carotid bruit.     Trachea: Trachea normal.  Cardiovascular:     Rate and Rhythm: Normal rate and regular rhythm.     Pulses: Normal pulses.          Carotid  pulses are 2+ on the right side and 2+ on the left side.      Radial pulses are 2+ on the right side and 2+ on the left side.       Dorsalis pedis pulses are 2+ on the right side and 2+ on the left side.       Posterior tibial pulses are 2+ on the right side and 2+ on the left side.     Heart sounds: Normal heart sounds, S1 normal and S2 normal. No murmur heard.    No friction rub. No gallop.  Pulmonary:     Effort: Pulmonary effort is normal. No respiratory distress.     Breath sounds: Normal breath sounds and air entry. No stridor. No wheezing, rhonchi or rales.  Chest:     Chest wall: No tenderness.     Comments: Breasts: risk and benefit of breast self-exam was discussed, patient declines to have breast exam  Abdominal:     General: Abdomen is flat. Bowel sounds are normal. There is no distension.     Palpations: Abdomen is soft. There is no mass.     Tenderness: There is no abdominal tenderness. There is no right CVA tenderness, left CVA tenderness, guarding or rebound.     Hernia: No hernia is present.  Genitourinary:    Comments: Exam deferred; denies complaints Musculoskeletal:        General: No swelling, tenderness, deformity or signs of injury. Normal range of motion.     Cervical back: Full passive range of motion without pain, normal range of motion and neck supple. No edema, rigidity or tenderness. No muscular tenderness.     Right lower leg: No edema.     Left lower leg: No edema.  Lymphadenopathy:     Cervical: No cervical adenopathy.     Right cervical: No superficial, deep or posterior cervical adenopathy.    Left cervical: No superficial, deep or posterior cervical adenopathy.  Skin:    General:  Skin is warm and dry.     Capillary Refill: Capillary refill takes less than 2 seconds.     Coloration: Skin is not jaundiced or pale.     Findings: No bruising, erythema, lesion or rash.  Neurological:     General: No focal deficit present.     Mental Status: She is alert and oriented to person, place, and time. Mental status is at baseline.     GCS: GCS eye subscore is 4. GCS verbal subscore is 5. GCS motor subscore is 6.     Sensory: Sensation is intact. No sensory deficit.     Motor: Motor function is intact. No weakness.     Coordination: Coordination is intact. Coordination normal.     Gait: Gait is intact. Gait normal.  Psychiatric:        Attention and Perception: Attention and perception normal.        Mood and Affect: Mood and affect normal.        Speech: Speech normal.        Behavior: Behavior normal. Behavior is cooperative.        Thought Content: Thought content normal.        Cognition and Memory: Cognition and memory normal.        Judgment: Judgment normal.     Wt Readings from Last 3 Encounters:  04/05/22 166 lb (75.3 kg)  11/29/21 164 lb (74.4 kg)  11/24/21 164 lb (74.4 kg)    BP 114/76  Pulse 67   Ht '5\' 6"'$  (1.676 m)   Wt 166 lb (75.3 kg)   SpO2 95%   BMI 26.79 kg/m   Assessment and Plan:  Problem List Items Addressed This Visit       Other   Annual physical exam - Primary    UTD on dental Working on tobacco and ETOH Staying active at gym No longer having elevated Bps; stopped low dose HCTZ Declines mammo UTD on Pap and Cologuard Things to do to keep yourself healthy  - Exercise at least 30-45 minutes a day, 3-4 days a week.  - Eat a low-fat diet with lots of fruits and vegetables, up to 7-9 servings per day.  - Seatbelts can save your life. Wear them always.  - Smoke detectors on every level of your home, check batteries every year.  - Eye Doctor - have an eye exam every 1-2 years  - Safe sex - if you may be exposed to STDs, use a condom.   - Alcohol -  If you drink, do it moderately, less than 2 drinks per day.  - [place]. Choose someone to speak for you if you are not able.  - Depression is common in our stressful world.If you're feeling down or losing interest in things you normally enjoy, please come in for a visit.  - Violence - If anyone is threatening or hurting you, please call immediately.       Relevant Orders   Comprehensive metabolic panel   CBC with Differential/Platelet   Lipid panel   Hemoglobin A1c   Vitamin D (25 hydroxy)   TSH   FSH/LH   Elevated LDL cholesterol level    Goal <100 with strong family hx, o/w Body mass index is 26.79 kg/m., tobacco use, ETOH use weekly      Relevant Orders   Lipid panel   Elevated serum glucose    Check A1c Continue to recommend balanced, lower carb meals. Smaller meal size, adding snacks. Choosing water as drink of choice and increasing purposeful exercise.       Relevant Orders   Hemoglobin A1c   Mammogram declined   Mixed hyperlipidemia    Chronic, now on statin Repeat LP  Previously LDL at 146 Denies complaints with Crestor at 10 mg      Tobacco dependence    Working on reduction; staying active at gym 4x/wk on lunch hour      Vasomotor symptoms due to menopause    Chronic, improving Repeat hormones to check progress of menopause  Has modified diet to assist       Relevant Orders   FSH/LH   I, Gwyneth Sprout, FNP, have reviewed all documentation for this visit. The documentation on 04/05/22 for the exam, diagnosis, procedures, and orders are all accurate and complete.

## 2022-04-05 NOTE — Assessment & Plan Note (Signed)
Check A1c Continue to recommend balanced, lower carb meals. Smaller meal size, adding snacks. Choosing water as drink of choice and increasing purposeful exercise.

## 2022-04-06 LAB — CBC WITH DIFFERENTIAL/PLATELET
Basophils Absolute: 0 10*3/uL (ref 0.0–0.2)
Basos: 0 %
EOS (ABSOLUTE): 0.1 10*3/uL (ref 0.0–0.4)
Eos: 2 %
Hematocrit: 41.1 % (ref 34.0–46.6)
Hemoglobin: 13.3 g/dL (ref 11.1–15.9)
Immature Grans (Abs): 0 10*3/uL (ref 0.0–0.1)
Immature Granulocytes: 0 %
Lymphocytes Absolute: 2.1 10*3/uL (ref 0.7–3.1)
Lymphs: 43 %
MCH: 30.4 pg (ref 26.6–33.0)
MCHC: 32.4 g/dL (ref 31.5–35.7)
MCV: 94 fL (ref 79–97)
Monocytes Absolute: 0.3 10*3/uL (ref 0.1–0.9)
Monocytes: 7 %
Neutrophils Absolute: 2.3 10*3/uL (ref 1.4–7.0)
Neutrophils: 48 %
Platelets: 236 10*3/uL (ref 150–450)
RBC: 4.37 x10E6/uL (ref 3.77–5.28)
RDW: 12.6 % (ref 11.7–15.4)
WBC: 4.8 10*3/uL (ref 3.4–10.8)

## 2022-04-06 LAB — LIPID PANEL
Chol/HDL Ratio: 2.5 ratio (ref 0.0–4.4)
Cholesterol, Total: 138 mg/dL (ref 100–199)
HDL: 56 mg/dL (ref >39)
LDL Chol Calc (NIH): 66 mg/dL (ref 0–99)
Triglycerides: 84 mg/dL (ref 0–149)
VLDL Cholesterol Cal: 16 mg/dL (ref 5–40)

## 2022-04-06 LAB — HEMOGLOBIN A1C
Est. average glucose Bld gHb Est-mCnc: 111 mg/dL
Hgb A1c MFr Bld: 5.5 % (ref 4.8–5.6)

## 2022-04-06 LAB — TSH: TSH: 0.788 u[IU]/mL (ref 0.450–4.500)

## 2022-04-06 LAB — COMPREHENSIVE METABOLIC PANEL
ALT: 21 IU/L (ref 0–32)
AST: 22 IU/L (ref 0–40)
Albumin/Globulin Ratio: 1.8 (ref 1.2–2.2)
Albumin: 4.2 g/dL (ref 3.8–4.9)
Alkaline Phosphatase: 75 IU/L (ref 44–121)
BUN/Creatinine Ratio: 12 (ref 9–23)
BUN: 10 mg/dL (ref 6–24)
Bilirubin Total: 0.5 mg/dL (ref 0.0–1.2)
CO2: 23 mmol/L (ref 20–29)
Calcium: 9 mg/dL (ref 8.7–10.2)
Chloride: 108 mmol/L — ABNORMAL HIGH (ref 96–106)
Creatinine, Ser: 0.82 mg/dL (ref 0.57–1.00)
Globulin, Total: 2.3 g/dL (ref 1.5–4.5)
Glucose: 94 mg/dL (ref 70–99)
Potassium: 4.5 mmol/L (ref 3.5–5.2)
Sodium: 143 mmol/L (ref 134–144)
Total Protein: 6.5 g/dL (ref 6.0–8.5)
eGFR: 87 mL/min/{1.73_m2} (ref >59)

## 2022-04-06 LAB — VITAMIN D 25 HYDROXY (VIT D DEFICIENCY, FRACTURES): Vit D, 25-Hydroxy: 32.2 ng/mL (ref 30.0–100.0)

## 2022-04-06 LAB — SPECIMEN STATUS REPORT

## 2022-04-06 LAB — FSH/LH
FSH: 91.5 m[IU]/mL
LH: 55.9 m[IU]/mL

## 2022-04-06 NOTE — Progress Notes (Signed)
Wow Kayla Alexander! Your cholesterol looks amazing with use of statin and exercise program at [facility] 4 days/week. Decreased by over 50%. Continue to recommend balanced, lower carb meals. Smaller meal size, adding snacks. Choosing water as drink of choice and increasing purposeful exercise. ASCVD risk reduced to 2% in 10 years!  The 10-year ASCVD risk score (Arnett DK, et al., 2019) is: 1.8%   Values used to calculate the score:     Age: 52 years     Sex: Female     Is Non-Hispanic African American: No     Diabetic: No     Tobacco smoker: Yes     Systolic Blood Pressure: 473 mmHg     Is BP treated: No     HDL Cholesterol: 56 mg/dL     Total Cholesterol: 138 mg/dL  Keep up the great work! Hormones pending.

## 2022-04-06 NOTE — Progress Notes (Signed)
Hormone levels increased; showing post menopausal confirmation

## 2022-05-11 ENCOUNTER — Encounter: Payer: Self-pay | Admitting: Family Medicine

## 2022-05-14 ENCOUNTER — Other Ambulatory Visit: Payer: Self-pay

## 2022-05-14 MED ORDER — FLUTICASONE PROPIONATE 50 MCG/ACT NA SUSP: 2.0000 | Freq: Every day | NASAL | 6 refills | Status: DC

## 2022-05-15 ENCOUNTER — Other Ambulatory Visit: Payer: Self-pay

## 2022-05-15 MED ORDER — FLUTICASONE PROPIONATE 50 MCG/ACT NA SUSP: 2.0000 | Freq: Every day | NASAL | 6 refills | Status: DC

## 2022-08-17 ENCOUNTER — Ambulatory Visit: Payer: Self-pay

## 2022-08-17 NOTE — Telephone Encounter (Signed)
Pt is experiencing fatigue, head rushes, and blurred vision. Pt says the blurred vision cleared up but she is still feeling fatigued. Pt says she isn't sure if her blood pressure is high or her sugar has dropped, but she's concerned with the symptoms.   Left message to call back about symptoms.

## 2022-08-17 NOTE — Telephone Encounter (Signed)
Reason for Disposition  [1] Blurred vision or visual changes AND [2] gradual onset (e.g., weeks, months)  Answer Assessment - Initial Assessment Questions 1. DESCRIPTION: "How has your vision changed?" (e.g., complete vision loss, blurred vision, double vision, floaters, etc.)    I had vision change this morning for an hour    I've been more tired the last week or two. 2. LOCATION: "One or both eyes?" If one, ask: "Which eye?"     Both eyes.  Spotty and shaky   No dizziness or passing out.  I'm having headaches that come and go. 3. SEVERITY: "Can you see anything?" If Yes, ask: "What can you see?" (e.g., fine print)     Yes 4. ONSET: "When did this begin?" "Did it start suddenly or has this been gradual?"     This morning for an hour 5. PATTERN: "Does this come and go, or has it been constant since it started?"     An hour this morning. 6. PAIN: "Is there any pain in your eye(s)?"  (Scale 1-10; or mild, moderate, severe)   - NONE (0): No pain.   - MILD (1-3): Doesn't interfere with normal activities.   - MODERATE (4-7): Interferes with normal activities or awakens from sleep.    - SEVERE (8-10): Excruciating pain, unable to do any normal activities.     I had this a year ago.   My head rushes a little. 7. CONTACTS-GLASSES: "Do you wear contacts or glasses?"     I just wear readers. 8. CAUSE: "What do you think is causing this visual problem?"     I don't know 9. OTHER SYMPTOMS: "Do you have any other symptoms?" (e.g., confusion, headache, arm or leg weakness, speech problems)     Mild headaches intermittently.   Head rushes when I get up. 10. PREGNANCY: "Is there any chance you are pregnant?" "When was your last menstrual period?"       Not asked  Protocols used: Vision Loss or Change-A-AH  Chief Complaint: Fatigue, head rushes when I get up from sitting, intermittent mild headaches. Symptoms: The morning for an hour I had really blurred vision.   It's fine now and only lasted an  hour.   I'm just feeling really fatigued for the past couple of weeks. Frequency: This morning for an hour the blurred vision occurred and resolved on its own. Pertinent Negatives: Patient denies Having symptoms now other than really feeling fatigued. Disposition: [] ED /[] Urgent Care (no appt availability in office) / [x] Appointment(In office/virtual)/ []  [facility]/ [] Home Care/ [] Refused Recommended Disposition /[] [place] [facility]/ []  Follow-up with PCP Additional Notes: Appt made with Merita Norton, FNP for 08/22/2022 at 1:00 with instructions to go to the ED or urgent care if her symptoms become worse or her vision becomes blurry again over the [facility].   She was agreeable to this plan.

## 2022-08-22 ENCOUNTER — Ambulatory Visit: Payer: No Typology Code available for payment source | Admitting: Family Medicine

## 2022-08-22 ENCOUNTER — Encounter: Payer: Self-pay | Admitting: Family Medicine

## 2022-08-22 VITALS — BP 111/75 | HR 64 | Ht 66.0 in | Wt 166.0 lb

## 2022-08-22 DIAGNOSIS — Z823 Family history of stroke: Secondary | ICD-10-CM

## 2022-08-22 DIAGNOSIS — F17200 Nicotine dependence, unspecified, uncomplicated: Secondary | ICD-10-CM | POA: Diagnosis not present

## 2022-08-22 DIAGNOSIS — G4482 Headache associated with sexual activity: Secondary | ICD-10-CM | POA: Diagnosis not present

## 2022-08-22 DIAGNOSIS — E782 Mixed hyperlipidemia: Secondary | ICD-10-CM

## 2022-08-22 MED ORDER — CETIRIZINE HCL 10 MG PO TABS: 10.0000 mg | ORAL_TABLET | Freq: Every day | ORAL | 3 refills | Status: AC

## 2022-08-22 NOTE — Progress Notes (Signed)
Established patient visit   Patient: Kayla Alexander   DOB: 10/17/1970   52 y.o. Female  MRN: [id number] Visit Date: 08/22/2022  Today's healthcare provider: Jacky Kindle, FNP  Re Introduced to nurse practitioner role and practice setting.  All questions answered.  Discussed provider/patient relationship and expectations.   Chief Complaint  Patient presents with   Fatigue    Pt stated--feeling fatigue, headache at night, blurred vision, blood rush on the headache--2 weeks. Pt stated, Dx sex headache.   Subjective    HPI HPI     Fatigue    Additional comments: Pt stated--feeling fatigue, headache at night, blurred vision, blood rush on the headache--2 weeks. Pt stated, Dx sex headache.      Last edited by Shelly Bombard, CMA on 08/22/2022  1:06 PM.      Medications: Outpatient Medications Prior to Visit  Medication Sig   fluticasone (FLONASE) 50 MCG/ACT nasal spray Place 2 sprays into both nostrils daily.   rosuvastatin (CRESTOR) 10 MG tablet Take 1 tablet (10 mg total) by mouth daily.   No facility-administered medications prior to visit.    Review of Systems  Last CBC Lab Results  Component Value Date   WBC 4.8 04/05/2022   HGB 13.3 04/05/2022   HCT 41.1 04/05/2022   MCV 94 04/05/2022   MCH 30.4 04/05/2022   RDW 12.6 04/05/2022   PLT 236 04/05/2022   Last metabolic panel Lab Results  Component Value Date   GLUCOSE 94 04/05/2022   NA 143 04/05/2022   K 4.5 04/05/2022   CL 108 (H) 04/05/2022   CO2 23 04/05/2022   BUN 10 04/05/2022   CREATININE 0.82 04/05/2022   EGFR 87 04/05/2022   CALCIUM 9.0 04/05/2022   PROT 6.5 04/05/2022   ALBUMIN 4.2 04/05/2022   LABGLOB 2.3 04/05/2022   AGRATIO 1.8 04/05/2022   BILITOT 0.5 04/05/2022   ALKPHOS 75 04/05/2022   AST 22 04/05/2022   ALT 21 04/05/2022   ANIONGAP 8 11/03/2017   Last lipids Lab Results  Component Value Date   CHOL 138 04/05/2022   HDL 56 04/05/2022   LDLCALC 66 04/05/2022   TRIG 84  04/05/2022   CHOLHDL 2.5 04/05/2022   Last hemoglobin A1c Lab Results  Component Value Date   HGBA1C 5.5 04/05/2022   Last thyroid functions Lab Results  Component Value Date   TSH 0.788 04/05/2022       Objective    BP 111/75 (BP Location: Right Arm, Patient Position: Sitting, Cuff Size: Normal)   Pulse 64   Ht 5\' 6"  (1.676 m)   Wt 166 lb (75.3 kg)   SpO2 98%   BMI 26.79 kg/m  BP Readings from Last 3 Encounters:  08/22/22 111/75  04/05/22 114/76  11/29/21 110/84   Wt Readings from Last 3 Encounters:  08/22/22 166 lb (75.3 kg)  04/05/22 166 lb (75.3 kg)  11/29/21 164 lb (74.4 kg)      Physical Exam Vitals and nursing note reviewed.  Constitutional:      General: She is not in acute distress.    Appearance: Normal appearance. She is overweight. She is not ill-appearing, toxic-appearing or diaphoretic.  HENT:     Head: Normocephalic and atraumatic.  Neck:     Vascular: No carotid bruit.  Cardiovascular:     Rate and Rhythm: Normal rate and regular rhythm.     Pulses: Normal pulses.     Heart sounds: Normal heart sounds. No murmur  heard.    No friction rub. No gallop.  Pulmonary:     Effort: Pulmonary effort is normal. No respiratory distress.     Breath sounds: Normal breath sounds. No stridor. No wheezing, rhonchi or rales.  Chest:     Chest wall: No tenderness.  Musculoskeletal:        General: No swelling, tenderness, deformity or signs of injury. Normal range of motion.     Cervical back: Neck supple.     Right lower leg: No edema.     Left lower leg: No edema.  Skin:    General: Skin is warm and dry.     Capillary Refill: Capillary refill takes less than 2 seconds.     Coloration: Skin is not jaundiced or pale.     Findings: No bruising, erythema, lesion or rash.  Neurological:     General: No focal deficit present.     Mental Status: She is alert and oriented to person, place, and time. Mental status is at baseline.     Cranial Nerves: No cranial  nerve deficit.     Sensory: No sensory deficit.     Motor: No weakness.     Coordination: Coordination normal.     Gait: Gait normal.     Deep Tendon Reflexes: Reflexes normal.  Psychiatric:        Mood and Affect: Mood normal.        Behavior: Behavior normal.        Thought Content: Thought content normal.        Judgment: Judgment normal.     No results found for any visits on 08/22/22.  Assessment & Plan     Problem List Items Addressed This Visit       Other   Family history of stroke   Relevant Orders   Ambulatory referral to Neurology   Mixed hyperlipidemia    Chronic, improved Deferred trial off crestor 10 mg to determine if worsening s/s The 10-year ASCVD risk score (Arnett DK, et al., 2019) is: 1.7%       Relevant Orders   Ambulatory referral to Neurology   Primary headache associated with sexual activity - Primary    Reports immense bilateral pressure/tension headaches associated with orgasm; has deferred orgasm during sexual encounters to prevent recurrence. Previously trial ed on propranolol, imitrex as well as low dose HCTZ. BP remains stable. Stable carotids; can seek neuro referral if desired or OK for carotid imaging. Discussed how she is on high dose statin, working on daily exercise, is close to healthy weight Body mass index is 26.79 kg/m. And BP is at goal.       Relevant Orders   Ambulatory referral to Neurology   Tobacco dependence    Chronic, stable Reports 5 cigs/nightly after work as well as on weekends with ETOH use Aware of risks with current use including MI or CVA Patient has made reductions in use and is working to improve her health in other areas Continue to monitor use; goal of cessation through gradual reduction. NRT available OTC if needed.      Relevant Orders   Ambulatory referral to Neurology   Return if symptoms worsen or fail to improve.     Leilani Merl, FNP, have reviewed all documentation for this visit. The  documentation on 08/22/22 for the exam, diagnosis, procedures, and orders are all accurate and complete.  Jacky Kindle, FNP  [facility] [phone] (phone) [phone] (fax)  [facility]

## 2022-08-22 NOTE — Assessment & Plan Note (Signed)
Reports immense bilateral pressure/tension headaches associated with orgasm; has deferred orgasm during sexual encounters to prevent recurrence. Previously trial ed on propranolol, imitrex as well as low dose HCTZ. BP remains stable. Stable carotids; can seek neuro referral if desired or OK for carotid imaging. Discussed how she is on high dose statin, working on daily exercise, is close to healthy weight Body mass index is 26.79 kg/m. And BP is at goal.

## 2022-08-22 NOTE — Assessment & Plan Note (Signed)
Chronic, improved Deferred trial off crestor 10 mg to determine if worsening s/s The 10-year ASCVD risk score (Arnett DK, et al., 2019) is: 1.7%

## 2022-08-22 NOTE — Assessment & Plan Note (Signed)
Chronic, stable Reports 5 cigs/nightly after work as well as on weekends with ETOH use Aware of risks with current use including MI or CVA Patient has made reductions in use and is working to improve her health in other areas Continue to monitor use; goal of cessation through gradual reduction. NRT available OTC if needed.

## 2022-09-26 ENCOUNTER — Ambulatory Visit: Payer: No Typology Code available for payment source | Admitting: Family Medicine

## 2022-09-26 ENCOUNTER — Encounter: Payer: Self-pay | Admitting: Family Medicine

## 2022-09-26 VITALS — BP 112/89 | HR 77 | Temp 97.9°F | Resp 14 | Ht 66.0 in | Wt 165.7 lb

## 2022-09-26 DIAGNOSIS — N951 Menopausal and female climacteric states: Secondary | ICD-10-CM | POA: Diagnosis not present

## 2022-09-26 DIAGNOSIS — R1084 Generalized abdominal pain: Secondary | ICD-10-CM | POA: Insufficient documentation

## 2022-09-26 NOTE — Progress Notes (Signed)
I,Vanessa  Vital,acting as a Neurosurgeon for Jacky Kindle, FNP.,have documented all relevant documentation on the behalf of Jacky Kindle, FNP,as directed by  Jacky Kindle, FNP while in the presence of Jacky Kindle, FNP.  Established patient visit   Patient: Kayla Alexander   DOB: [birth year]   52 y.o. Female  MRN: [id number] Visit Date: 09/26/2022  Today's healthcare provider: Jacky Kindle, FNP  Re Introduced to nurse practitioner role and practice setting.  All questions answered.  Discussed provider/patient relationship and expectations.   Chief Complaint  Patient presents with   Menopause    Patient states she has been feeling nauseas, tired, back hurts, stomach cramps, over eating. Has been going for about 3 weeks. She believes it could be menopause but Would like to confirm it.   Subjective    HPI HPI     Menopause    Additional comments: Patient states she has been feeling nauseas, tired, back hurts, stomach cramps, over eating. Has been going for about 3 weeks. She believes it could be menopause but Would like to confirm it.      Last edited by Lubertha Basque, CMA on 09/26/2022  8:13 AM.      Complaints of symptoms of mood disorder 2/2 menopausal symptoms- recently went on vacation with family and notes when she returns she feels sad and irritable as she missed her children. She took time for herself to allow for re-entry into workplace and reduced stressed triggers. Wishes to defer medication start at this time. Continues to focus on healthy living and mindset to assist. Discussed supplements including black cohosh and tart cherry juice available OTC if patient is interest vs Rx estrogen [high risk given tobacco use] and/or SSRI.   Medications: Outpatient Medications Prior to Visit  Medication Sig   cetirizine (ZYRTEC) 10 MG tablet Take 1 tablet (10 mg total) by mouth daily.   fluticasone (FLONASE) 50 MCG/ACT nasal spray Place 2 sprays into both nostrils daily.    rosuvastatin (CRESTOR) 10 MG tablet Take 1 tablet (10 mg total) by mouth daily.   No facility-administered medications prior to visit.    Review of Systems  Constitutional:  Positive for appetite change.  Gastrointestinal:  Positive for nausea.  All other systems reviewed and are negative.      Objective    BP 112/89 (BP Location: Right Arm, Patient Position: Sitting, Cuff Size: Normal)   Pulse 77   Temp 97.9 F (36.6 C)   Resp 14   Ht 5\' 6"  (1.676 m)   Wt 165 lb 11.2 oz (75.2 kg)   SpO2 98%   BMI 26.74 kg/m    Physical Exam Vitals and nursing note reviewed.  Constitutional:      General: She is not in acute distress.    Appearance: Normal appearance. She is normal weight. She is not ill-appearing, toxic-appearing or diaphoretic.  HENT:     Head: Normocephalic and atraumatic.  Cardiovascular:     Rate and Rhythm: Normal rate and regular rhythm.     Pulses: Normal pulses.     Heart sounds: Normal heart sounds. No murmur heard.    No friction rub. No gallop.  Pulmonary:     Effort: Pulmonary effort is normal. No respiratory distress.     Breath sounds: Normal breath sounds. No stridor. No wheezing, rhonchi or rales.  Chest:     Chest wall: No tenderness.  Abdominal:     General: Bowel sounds are normal. There is  no distension.     Palpations: Abdomen is soft.     Tenderness: There is no abdominal tenderness. There is no right CVA tenderness, left CVA tenderness or guarding.  Genitourinary:    Comments: Denies s/s of UTI Musculoskeletal:        General: No swelling, tenderness, deformity or signs of injury. Normal range of motion.     Right lower leg: No edema.     Left lower leg: No edema.  Skin:    General: Skin is warm and dry.     Capillary Refill: Capillary refill takes less than 2 seconds.     Coloration: Skin is not jaundiced or pale.     Findings: No bruising, erythema, lesion or rash.  Neurological:     General: No focal deficit present.     Mental  Status: She is alert and oriented to person, place, and time. Mental status is at baseline.     Cranial Nerves: No cranial nerve deficit.     Sensory: No sensory deficit.     Motor: No weakness.     Coordination: Coordination normal.  Psychiatric:        Mood and Affect: Mood normal.        Behavior: Behavior normal.        Thought Content: Thought content normal.        Judgment: Judgment normal.     No results found for any visits on 09/26/22.  Assessment & Plan     Problem List Items Addressed This Visit       Other   Generalized abdominal pain    3 weeks; recent travel prior; no sick contacts Well appearing; not in distress Will repeat CBC and BMP to assist No weight loss or gain noted despite change in eating habits and reports of pain CTM      Relevant Orders   CBC   Basic Metabolic Panel (BMET)   Vasomotor symptoms due to menopause - Primary    Acute on chronic, worsening in past 3 weeks Defers start of medication at this time Review of labs from 03/2022 confirm menopausal Denies s/s of UTI No abnormalities noted in exam and patient remains well appearing and HDS       Return if symptoms worsen or fail to improve.     Leilani Merl, FNP, have reviewed all documentation for this visit. The documentation on 09/26/22 for the exam, diagnosis, procedures, and orders are all accurate and complete.  Jacky Kindle, FNP  [facility] [phone] (phone) [phone] (fax)  [facility]

## 2022-09-26 NOTE — Assessment & Plan Note (Signed)
Acute on chronic, worsening in past 3 weeks Defers start of medication at this time Review of labs from 03/2022 confirm menopausal Denies s/s of UTI No abnormalities noted in exam and patient remains well appearing and HDS

## 2022-09-26 NOTE — Assessment & Plan Note (Signed)
3 weeks; recent travel prior; no sick contacts Well appearing; not in distress Will repeat CBC and BMP to assist No weight loss or gain noted despite change in eating habits and reports of pain CTM

## 2022-09-27 LAB — BASIC METABOLIC PANEL
BUN/Creatinine Ratio: 14 (ref 9–23)
BUN: 12 mg/dL (ref 6–24)
CO2: 22 mmol/L (ref 20–29)
Calcium: 9.6 mg/dL (ref 8.7–10.2)
Chloride: 106 mmol/L (ref 96–106)
Creatinine, Ser: 0.88 mg/dL (ref 0.57–1.00)
Glucose: 89 mg/dL (ref 70–99)
Potassium: 4.4 mmol/L (ref 3.5–5.2)
Sodium: 143 mmol/L (ref 134–144)
eGFR: 79 mL/min/{1.73_m2} (ref >59)

## 2022-09-27 LAB — CBC
Hematocrit: 43.4 % (ref 34.0–46.6)
Hemoglobin: 14.8 g/dL (ref 11.1–15.9)
MCH: 31.5 pg (ref 26.6–33.0)
MCHC: 34.1 g/dL (ref 31.5–35.7)
MCV: 92 fL (ref 79–97)
Platelets: 233 10*3/uL (ref 150–450)
RBC: 4.7 x10E6/uL (ref 3.77–5.28)
RDW: 12.3 % (ref 11.7–15.4)
WBC: 4.7 10*3/uL (ref 3.4–10.8)

## 2022-09-27 NOTE — Progress Notes (Signed)
Labs unchanged; continue to work on Systems developer to assist mood symptoms.

## 2022-11-01 ENCOUNTER — Ambulatory Visit: Payer: No Typology Code available for payment source | Admitting: Family Medicine

## 2022-11-01 ENCOUNTER — Encounter: Payer: Self-pay | Admitting: Family Medicine

## 2022-11-01 VITALS — BP 116/87 | HR 71 | Temp 97.6°F | Resp 12 | Ht 66.0 in | Wt 165.4 lb

## 2022-11-01 DIAGNOSIS — M545 Low back pain, unspecified: Secondary | ICD-10-CM

## 2022-11-01 DIAGNOSIS — G8929 Other chronic pain: Secondary | ICD-10-CM | POA: Insufficient documentation

## 2022-11-01 DIAGNOSIS — Z1231 Encounter for screening mammogram for malignant neoplasm of breast: Secondary | ICD-10-CM | POA: Insufficient documentation

## 2022-11-01 DIAGNOSIS — M25551 Pain in right hip: Secondary | ICD-10-CM

## 2022-11-01 DIAGNOSIS — M25552 Pain in left hip: Secondary | ICD-10-CM | POA: Diagnosis not present

## 2022-11-01 NOTE — Patient Instructions (Addendum)
Please call and schedule your bone scan & mammogram Main: [phone]  Sunday:Closed Monday:7:20 AM - 5:00 PM Tuesday:7:20 AM - 5:00 PM Wednesday:7:20 AM - 5:00 PM Thursday:7:20 AM - 5:00 PM Friday:7:20 AM - 4:30 PM Saturday:Closed

## 2022-11-01 NOTE — Progress Notes (Signed)
Established patient visit   Patient: Kayla Alexander   DOB: [birth year]   52 y.o. Female  MRN: [id number] Visit Date: 11/01/2022  Today's healthcare provider: Jacky Kindle, FNP  Introduced to nurse practitioner role and practice setting.  All questions answered.  Discussed provider/patient relationship and expectations.  Subjective    HPI HPI   Patient reports recurrent joint pain x for several years since her twenties. She reports going to [facility]. And they did x-rays. X-ray showed DDD. She is requesting test for arthritis.  Last edited by Myles Lipps, CMA on 11/01/2022  8:26 AM.      Medications: Outpatient Medications Prior to Visit  Medication Sig   cetirizine (ZYRTEC) 10 MG tablet Take 1 tablet (10 mg total) by mouth daily.   fluticasone (FLONASE) 50 MCG/ACT nasal spray Place 2 sprays into both nostrils daily.   rosuvastatin (CRESTOR) 10 MG tablet Take 1 tablet (10 mg total) by mouth daily.   No facility-administered medications prior to visit.     Objective    BP 116/87 (BP Location: Left Arm, Patient Position: Sitting, Cuff Size: Large)   Pulse 71   Temp 97.6 F (36.4 C) (Temporal)   Resp 12   Ht 5\' 6"  (1.676 m)   Wt 165 lb 6.4 oz (75 kg)   SpO2 97%   BMI 26.70 kg/m   Physical Exam Vitals and nursing note reviewed.  Constitutional:      General: She is not in acute distress.    Appearance: Normal appearance. She is overweight. She is not ill-appearing, toxic-appearing or diaphoretic.  HENT:     Head: Normocephalic and atraumatic.  Cardiovascular:     Rate and Rhythm: Normal rate and regular rhythm.     Pulses: Normal pulses.     Heart sounds: Normal heart sounds. No murmur heard.    No friction rub. No gallop.  Pulmonary:     Effort: Pulmonary effort is normal. No respiratory distress.     Breath sounds: Normal breath sounds. No stridor. No wheezing, rhonchi or rales.  Chest:     Chest wall: No tenderness.  Abdominal:     General: Bowel  sounds are normal.     Palpations: Abdomen is soft.  Musculoskeletal:        General: No swelling, tenderness, deformity or signs of injury. Normal range of motion.     Right lower leg: No edema.     Left lower leg: No edema.     Comments: Chronic low back pain and bilateral hip pain; interferes with sleep  Skin:    General: Skin is warm and dry.     Capillary Refill: Capillary refill takes less than 2 seconds.     Coloration: Skin is not jaundiced or pale.     Findings: No bruising, erythema, lesion or rash.  Neurological:     General: No focal deficit present.     Mental Status: She is alert and oriented to person, place, and time. Mental status is at baseline.     Cranial Nerves: No cranial nerve deficit.     Sensory: No sensory deficit.     Motor: No weakness.     Coordination: Coordination normal.  Psychiatric:        Mood and Affect: Mood normal.        Behavior: Behavior normal.        Thought Content: Thought content normal.        Judgment: Judgment normal.  No results found for any visits on 11/01/22.  Assessment & Plan     Problem List Items Addressed This Visit       Other   Chronic bilateral low back pain without sciatica    Acute on chronic self limiting Request for labs to r/o other causes Not improved with stretching or exercise Does not want joint injections or pain meds Request for answers Referral to ortho to assist      Relevant Orders   Ambulatory referral to Orthopedic Surgery   Sed Rate (ESR)   C-reactive protein   Rheumatoid Factor   Spotted Fever Group Antibodies   Lyme Disease Abs IgG, IgM, IFA, CSF   Lupus anticoagulant panel   Vitamin B1   Vitamin B6   B12 and Folate Panel   Vitamin D (25 hydroxy)   DG Bone Density   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    Acute on chronic self limiting Request for labs to r/o other causes Not improved with stretching or exercise Does not want joint injections or pain meds Request for  answers Referral to ortho to assist      Relevant Orders   Ambulatory referral to Orthopedic Surgery   Sed Rate (ESR)   C-reactive protein   Rheumatoid Factor   Spotted Fever Group Antibodies   Lyme Disease Abs IgG, IgM, IFA, CSF   Lupus anticoagulant panel   Vitamin B1   Vitamin B6   B12 and Folate Panel   Vitamin D (25 hydroxy)   DG Bone Density   Screening mammogram for breast cancer   Relevant Orders   MM 3D SCREENING MAMMOGRAM BILATERAL BREAST   Return if symptoms worsen or fail to improve.     Leilani Merl, FNP, have reviewed all documentation for this visit. The documentation on 11/01/22 for the exam, diagnosis, procedures, and orders are all accurate and complete.  Jacky Kindle, FNP  [facility] [phone] (phone) [phone] (fax)  [facility]

## 2022-11-01 NOTE — Assessment & Plan Note (Signed)
Acute on chronic self limiting Request for labs to r/o other causes Not improved with stretching or exercise Does not want joint injections or pain meds Request for answers Referral to ortho to assist

## 2022-11-01 NOTE — Assessment & Plan Note (Addendum)
Acute on chronic self limiting Request for labs to r/o other causes Not improved with stretching or exercise Does not want joint injections or pain meds Request for answers Referral to ortho to assist

## 2022-11-06 NOTE — Progress Notes (Signed)
Labs are all remarkably stable; recommend B12 or B complex daily to assist with borderline low levels.

## 2022-11-07 ENCOUNTER — Ambulatory Visit: Payer: No Typology Code available for payment source | Admitting: Family Medicine

## 2022-11-07 ENCOUNTER — Encounter: Payer: Self-pay | Admitting: Family Medicine

## 2022-11-07 VITALS — BP 109/89 | HR 66 | Wt 165.0 lb

## 2022-11-07 DIAGNOSIS — E538 Deficiency of other specified B group vitamins: Secondary | ICD-10-CM

## 2022-11-07 MED ORDER — CYANOCOBALAMIN 1000 MCG/ML IJ SOLN
1000.0000 ug | INTRAMUSCULAR | Status: AC
Start: 2022-11-07 — End: 2022-12-05
  Administered 2022-11-07 – 2022-11-14 (×2): 1000 ug via INTRAMUSCULAR

## 2022-11-07 MED ORDER — HYDROCHLOROTHIAZIDE 12.5 MG PO CAPS: 12.5000 mg | ORAL_CAPSULE | Freq: Every day | ORAL | 3 refills | Status: DC

## 2022-11-07 NOTE — Patient Instructions (Signed)
Please contact [phone] to schedule your mammogram. You will be asked your location preference to have procedure performed. You have two options listed below.  1) [facility] located at [address] [place], [place] [address] 2) [facility] [place] located at [address] [place],  [address]  Upon results being received our office will contact you. As well as all results can be viewed through your MyChart. Please feel free to contact us if you have any further questions or concerns.   

## 2022-11-07 NOTE — Assessment & Plan Note (Signed)
Initial labs of 295 with complaints of chronic fatigue and pain Weekly x 4 weeks; may transition to home self administration Plan to check at 3 months

## 2022-11-07 NOTE — Progress Notes (Signed)
    Established patient visit   Patient: Kayla Alexander   DOB: December 01, 1970   52 y.o. Female  MRN: [id number] Visit Date: 11/07/2022  Today's healthcare provider: Jacky Kindle, FNP  Introduced to nurse practitioner role and practice setting.  All questions answered.  Discussed provider/patient relationship and expectations.  Subjective    HPI HPI     Injections    Additional comments: Patient would like to begin B-12 injections       Last edited by Acey Lav, CMA on 11/07/2022  8:35 AM.      Medications: Outpatient Medications Prior to Visit  Medication Sig   cetirizine (ZYRTEC) 10 MG tablet Take 1 tablet (10 mg total) by mouth daily.   fluticasone (FLONASE) 50 MCG/ACT nasal spray Place 2 sprays into both nostrils daily.   rosuvastatin (CRESTOR) 10 MG tablet Take 1 tablet (10 mg total) by mouth daily.   No facility-administered medications prior to visit.    Review of Systems    Objective    BP 109/89   Pulse 66   Wt 165 lb (74.8 kg)   SpO2 98%   BMI 26.63 kg/m   Physical Exam Constitutional:      Appearance: Normal appearance. She is overweight.  Neurological:     General: No focal deficit present.     Mental Status: She is alert.  Psychiatric:        Mood and Affect: Mood normal.        Behavior: Behavior normal.        Thought Content: Thought content normal.        Judgment: Judgment normal.     No results found for any visits on 11/07/22.  Assessment & Plan     Problem List Items Addressed This Visit       Other   B12 deficiency - Primary    Initial labs of 295 with complaints of chronic fatigue and pain Weekly x 4 weeks; may transition to home self administration Plan to check at 3 months       Relevant Medications   cyanocobalamin (VITAMIN B12) injection 1,000 mcg   Return in about 1 week (around 11/14/2022) for nurse follow up.     Leilani Merl, FNP, have reviewed all documentation for this visit. The documentation on 11/07/22  for the exam, diagnosis, procedures, and orders are all accurate and complete.  Jacky Kindle, FNP  [facility] [phone] (phone) [phone] (fax)  [facility]

## 2022-11-14 ENCOUNTER — Ambulatory Visit (INDEPENDENT_AMBULATORY_CARE_PROVIDER_SITE_OTHER): Payer: No Typology Code available for payment source | Admitting: Family Medicine

## 2022-11-14 DIAGNOSIS — E538 Deficiency of other specified B group vitamins: Secondary | ICD-10-CM | POA: Diagnosis not present

## 2022-11-14 MED ORDER — CYANOCOBALAMIN 1000 MCG/ML IJ SOLN
INTRAMUSCULAR | 0 refills | Status: AC
Start: 2022-11-14 — End: 2022-12-26

## 2022-11-14 MED ORDER — "SYRINGE/NEEDLE (DISP) 22G X 1-1/2"" 3 ML MISC": 1.0000 | 0 refills | Status: DC

## 2022-11-14 NOTE — Patient Instructions (Signed)
Self-Injecting Insulin with a Vial and Syringe This video provides a step-by-step demonstration of how to give an insulin injection using a vial of medicine and a needle and syringe. To view the content, go to this web address: [email or web address].elsevier.com/[email or web address]  This video will expire on: 09/16/2024. If you need access to this video following this date, please reach out to the healthcare provider who assigned it to you. This information is not intended to replace advice given to you by your health care provider. Make sure you discuss any questions you have with your health care provider. Elsevier Patient Education  2024 [employer].

## 2022-11-14 NOTE — Progress Notes (Signed)
Patient self administered B12 into right quadriceps to be observed she is administering correctly. Patient reports she would now like to continue administering her injections at home.

## 2022-12-10 ENCOUNTER — Telehealth: Payer: Self-pay | Admitting: Family Medicine

## 2022-12-10 NOTE — Telephone Encounter (Signed)
Appt scheduled for 12/12/2022 at 350 on nurse schedule

## 2022-12-10 NOTE — Telephone Encounter (Signed)
Pt is calling in because she wants to know if she can come in first thing tomorrow morning for a B12 injection. Pt says she has been struggling to administer the shots herself and wants assistance from a nurse.

## 2022-12-12 ENCOUNTER — Ambulatory Visit (INDEPENDENT_AMBULATORY_CARE_PROVIDER_SITE_OTHER): Payer: No Typology Code available for payment source

## 2022-12-12 DIAGNOSIS — E538 Deficiency of other specified B group vitamins: Secondary | ICD-10-CM | POA: Diagnosis not present

## 2022-12-12 MED ORDER — CYANOCOBALAMIN 1000 MCG/ML IJ SOLN
1000.0000 ug | Freq: Once | INTRAMUSCULAR | Status: AC
Start: 2022-12-12 — End: 2022-12-12
  Administered 2022-12-12: 1000 ug via INTRAMUSCULAR

## 2022-12-13 ENCOUNTER — Other Ambulatory Visit: Payer: Self-pay | Admitting: Family Medicine

## 2022-12-13 DIAGNOSIS — E538 Deficiency of other specified B group vitamins: Secondary | ICD-10-CM

## 2022-12-19 ENCOUNTER — Ambulatory Visit (INDEPENDENT_AMBULATORY_CARE_PROVIDER_SITE_OTHER): Payer: No Typology Code available for payment source

## 2022-12-19 DIAGNOSIS — E538 Deficiency of other specified B group vitamins: Secondary | ICD-10-CM | POA: Diagnosis not present

## 2022-12-19 MED ORDER — CYANOCOBALAMIN 1000 MCG/ML IJ SOLN
1000.0000 ug | Freq: Once | INTRAMUSCULAR | Status: AC
Start: 2022-12-19 — End: 2022-12-19
  Administered 2022-12-19: 1000 ug via INTRAMUSCULAR

## 2022-12-25 ENCOUNTER — Other Ambulatory Visit: Payer: Self-pay | Admitting: Family Medicine

## 2022-12-25 DIAGNOSIS — Z8249 Family history of ischemic heart disease and other diseases of the circulatory system: Secondary | ICD-10-CM

## 2022-12-25 DIAGNOSIS — E782 Mixed hyperlipidemia: Secondary | ICD-10-CM

## 2022-12-25 DIAGNOSIS — Z83438 Family history of other disorder of lipoprotein metabolism and other lipidemia: Secondary | ICD-10-CM

## 2022-12-25 DIAGNOSIS — Z823 Family history of stroke: Secondary | ICD-10-CM

## 2022-12-25 NOTE — Telephone Encounter (Signed)
Requested Prescriptions  Pending Prescriptions Disp Refills   rosuvastatin (CRESTOR) 10 MG tablet [Pharmacy Med Name: ROSUVASTATIN 10MG  TABLETS] 90 tablet 0    Sig: TAKE 1 TABLET(10 MG) BY MOUTH DAILY     Cardiovascular:  Antilipid - Statins 2 Failed - 12/25/2022 10:48 AM      Failed - Lipid Panel in normal range within the last 12 months    Cholesterol, Total  Date Value Ref Range Status  04/05/2022 138 100 - 199 mg/dL Final   LDL Chol Calc (NIH)  Date Value Ref Range Status  04/05/2022 66 0 - 99 mg/dL Final   HDL  Date Value Ref Range Status  04/05/2022 56 >39 mg/dL Final   Triglycerides  Date Value Ref Range Status  04/05/2022 84 0 - 149 mg/dL Final         Passed - Cr in normal range and within 360 days    Creat  Date Value Ref Range Status  10/17/2015 0.92 0.50 - 1.10 mg/dL Final   Creatinine, Ser  Date Value Ref Range Status  09/26/2022 0.88 0.57 - 1.00 mg/dL Final         Passed - Patient is not pregnant      Passed - Valid encounter within last 12 months    Recent Outpatient Visits           1 month ago B12 deficiency   [facility] Merita Norton T, FNP   1 month ago Chronic pain of both hips   [facility] Merita Norton T, FNP   3 months ago Vasomotor symptoms due to menopause   [facility] Merita Norton T, FNP   4 months ago Primary headache associated with sexual activity   [facility] Jacky Kindle, FNP   8 months ago Annual physical exam   [facility] Jacky Kindle, [place]

## 2023-01-09 ENCOUNTER — Ambulatory Visit: Payer: No Typology Code available for payment source

## 2023-01-16 ENCOUNTER — Ambulatory Visit: Payer: No Typology Code available for payment source

## 2023-01-21 ENCOUNTER — Telehealth: Payer: Self-pay

## 2023-01-21 NOTE — Telephone Encounter (Signed)
Copied from CRM [id number]. Topic: Appointment Scheduling - Scheduling Inquiry for Clinic >> Jan 21, 2023  9:50 AM Franchot Heidelberg wrote: Reason for CRM: Pt needs to reschedule her B12 injection appt   Best contact:  [phone]

## 2023-01-23 NOTE — Telephone Encounter (Signed)
Appt made

## 2023-01-28 ENCOUNTER — Telehealth: Payer: Self-pay | Admitting: Family Medicine

## 2023-01-28 NOTE — Telephone Encounter (Signed)
[facility] faxed refill request for the following medications:   rosuvastatin (CRESTOR) 10 MG tablet    Please advise.

## 2023-01-29 NOTE — Telephone Encounter (Signed)
Patient scheduled for chronic follow-up tomorrow. Will fill then.

## 2023-01-30 ENCOUNTER — Ambulatory Visit (INDEPENDENT_AMBULATORY_CARE_PROVIDER_SITE_OTHER): Payer: No Typology Code available for payment source

## 2023-01-30 DIAGNOSIS — Z83438 Family history of other disorder of lipoprotein metabolism and other lipidemia: Secondary | ICD-10-CM | POA: Diagnosis not present

## 2023-01-30 DIAGNOSIS — Z823 Family history of stroke: Secondary | ICD-10-CM

## 2023-01-30 DIAGNOSIS — E538 Deficiency of other specified B group vitamins: Secondary | ICD-10-CM

## 2023-01-30 DIAGNOSIS — Z8249 Family history of ischemic heart disease and other diseases of the circulatory system: Secondary | ICD-10-CM

## 2023-01-30 DIAGNOSIS — E782 Mixed hyperlipidemia: Secondary | ICD-10-CM

## 2023-01-30 MED ORDER — CYANOCOBALAMIN 1000 MCG/ML IJ SOLN
1000.0000 ug | Freq: Once | INTRAMUSCULAR | Status: AC
  Administered 2023-01-30: 1000 ug via INTRAMUSCULAR

## 2023-01-30 MED ORDER — ROSUVASTATIN CALCIUM 10 MG PO TABS: 10.0000 mg | ORAL_TABLET | Freq: Every day | ORAL | 0 refills | Status: DC

## 2023-01-30 NOTE — Progress Notes (Signed)
Kayla Alexander is a 52 y.o. presents today for injection of B 12. Patient given 1000 mcg of B12 in right deltoid. No adverse reactions noted.

## 2023-01-30 NOTE — Addendum Note (Signed)
Addended by: Jama Flavors on: 01/30/2023 04:50 PM   Modules accepted: Orders

## 2023-02-01 NOTE — Telephone Encounter (Signed)
Rx sent 11/06 #90 0RF

## 2023-02-27 ENCOUNTER — Ambulatory Visit: Payer: No Typology Code available for payment source

## 2023-03-14 ENCOUNTER — Telehealth: Payer: Self-pay | Admitting: Family Medicine

## 2023-03-14 NOTE — Telephone Encounter (Signed)
Ok. Make sure next f/u is scheduled with me

## 2023-03-14 NOTE — Telephone Encounter (Signed)
Copied from CRM [id number]. Topic: General - Other >> Mar 14, 2023  9:34 AM Phill Myron wrote: Reason for CRM: Due to Provider Suzie Portela leaving the practice, pt Moilanen would like to know if she can go back to Dr Shirlee Latch, MD, since she was her PCP before. Please advise

## 2023-04-10 ENCOUNTER — Ambulatory Visit: Payer: No Typology Code available for payment source | Admitting: Dermatology

## 2023-04-10 DIAGNOSIS — L570 Actinic keratosis: Secondary | ICD-10-CM

## 2023-04-10 DIAGNOSIS — W908XXA Exposure to other nonionizing radiation, initial encounter: Secondary | ICD-10-CM

## 2023-04-10 DIAGNOSIS — D1801 Hemangioma of skin and subcutaneous tissue: Secondary | ICD-10-CM | POA: Diagnosis not present

## 2023-04-10 NOTE — Patient Instructions (Addendum)

## 2023-04-10 NOTE — Progress Notes (Signed)
   New Patient Visit   Subjective  Kayla Alexander is a 53 y.o. female who presents for the following: pt concerned for spot on nose, bottom lip, and reports scalp feeling like it's burning.   The patient has spots, moles and lesions to be evaluated, some may be new or changing and the patient may have concern these could be cancer.  The following portions of the chart were reviewed this encounter and updated as appropriate: medications, allergies, medical history  Review of Systems:  No other skin or systemic complaints except as noted in HPI or Assessment and Plan.  Objective  Well appearing patient in no apparent distress; mood and affect are within normal limits.   A focused examination was performed of the following areas: Dorsum of nose, L lower cutaneous lip  Relevant exam findings are noted in the Assessment and Plan.  Dorsum of Nose x1, L lower cutaneous lip x1 Pink scaly macules  Assessment & Plan    AK (ACTINIC KERATOSIS) (2) Dorsum of Nose x1, L lower cutaneous lip x1 Actinic keratoses are precancerous spots that appear secondary to cumulative UV radiation exposure/sun exposure over time. They are chronic with expected duration over 1 year. A portion of actinic keratoses will progress to squamous cell carcinoma of the skin. It is not possible to reliably predict which spots will progress to skin cancer and so treatment is recommended to prevent development of skin cancer.  Recommend daily broad spectrum sunscreen SPF 30+ to sun-exposed areas, reapply every 2 hours as needed.  Recommend staying in the shade or wearing long sleeves, sun glasses (UVA+UVB protection) and wide brim hats (4-inch brim around the entire circumference of the hat). Call for new or changing lesions. Destruction of lesion - Dorsum of Nose x1, L lower cutaneous lip x1 Complexity: simple   Destruction method: cryotherapy   Informed consent: discussed and consent obtained   Timeout:  patient name,  date of birth, surgical site, and procedure verified Lesion destroyed using liquid nitrogen: Yes   Region frozen until ice ball extended beyond lesion: Yes   Cryo cycles: 1 or 2. Outcome: patient tolerated procedure well with no complications   Post-procedure details: wound care instructions given   CHERRY ANGIOMA     HEMANGIOMA Exam: red papule(s) Discussed benign nature. Recommend observation. Call for changes.   Return in about 2 months (around 06/08/2023) for spots on nose and lip.  Wynonia Lawman, CMA, am acting as scribe for Elie Goody, MD .   Documentation: I have reviewed the above documentation for accuracy and completeness, and I agree with the above.  Elie Goody, MD

## 2023-04-14 ENCOUNTER — Encounter: Payer: Self-pay | Admitting: Dermatology

## 2023-04-16 ENCOUNTER — Encounter: Payer: Self-pay | Admitting: Family Medicine

## 2023-04-16 ENCOUNTER — Ambulatory Visit (INDEPENDENT_AMBULATORY_CARE_PROVIDER_SITE_OTHER): Payer: No Typology Code available for payment source | Admitting: Family Medicine

## 2023-04-16 VITALS — BP 102/79 | HR 71 | Ht 66.0 in | Wt 143.0 lb

## 2023-04-16 DIAGNOSIS — E782 Mixed hyperlipidemia: Secondary | ICD-10-CM

## 2023-04-16 DIAGNOSIS — R739 Hyperglycemia, unspecified: Secondary | ICD-10-CM | POA: Diagnosis not present

## 2023-04-16 DIAGNOSIS — Z0000 Encounter for general adult medical examination without abnormal findings: Secondary | ICD-10-CM

## 2023-04-16 NOTE — Progress Notes (Signed)
Complete physical exam   Patient: Kayla Alexander   DOB: 07-29-1970   53 y.o. Female  MRN: [id number] Visit Date: 04/16/2023  Today's healthcare provider: Shirlee Latch, MD   Chief Complaint  Patient presents with   Annual Exam    Last completed 04/05/22 Diet - 02-1299 Calories daily Exercise - daily for 20-30 minutes Feeling - Well Sleeping - Pretty good  Concerns - none   Subjective    Kayla Alexander is a 53 y.o. female who presents today for a complete physical exam.    Discussed the use of AI scribe software for clinical note transcription with the patient, who gave verbal consent to proceed.  History of Present Illness   The patient, with a history of high blood pressure and high cholesterol, presents for a routine physical examination. She reports significant weight loss, which has led to improvements in her overall health. She notes reduced hip pain and better blood pressure control. She has stopped taking hydrochlorothiazide due to these improvements but continues to take Crestor for cholesterol management. She also reports improvements in menopause symptoms. The patient has made significant lifestyle changes, including dietary modifications, which she believes have contributed to her health improvements. She expresses frustration with friends who are not willing to make similar changes. The patient declines mammogram and shingles vaccine but is up to date on other preventive care.        Last depression screening scores    08/22/2022    1:09 PM 04/05/2022    8:51 AM 11/24/2021    3:05 PM  PHQ 2/9 Scores  PHQ - 2 Score 0 0 0  PHQ- 9 Score 0 0 0   Last fall risk screening    08/22/2022    1:09 PM  Fall Risk   Falls in the past year? 0  Number falls in past yr: 0  Injury with Fall? 0        Medications: Outpatient Medications Prior to Visit  Medication Sig   cetirizine (ZYRTEC) 10 MG tablet Take 1 tablet (10 mg total) by mouth daily.   fluticasone  (FLONASE) 50 MCG/ACT nasal spray Place 2 sprays into both nostrils daily.   rosuvastatin (CRESTOR) 10 MG tablet Take 1 tablet (10 mg total) by mouth daily.   SYRINGE-NEEDLE, DISP, 3 ML 22G X 1-1/2" 3 ML MISC 1 each by Does not apply route as directed.   [DISCONTINUED] hydrochlorothiazide (MICROZIDE) 12.5 MG capsule Take 1 capsule (12.5 mg total) by mouth daily.   No facility-administered medications prior to visit.    Review of Systems    Objective    BP 102/79 (BP Location: Left Arm, Patient Position: Sitting, Cuff Size: Normal)   Pulse 71   Ht 5\' 6"  (1.676 m)   Wt 143 lb (64.9 kg)   SpO2 100%   BMI 23.08 kg/m    Physical Exam Vitals reviewed.  Constitutional:      General: She is not in acute distress.    Appearance: Normal appearance. She is well-developed. She is not diaphoretic.  HENT:     Head: Normocephalic and atraumatic.     Right Ear: Tympanic membrane, ear canal and external ear normal.     Left Ear: Tympanic membrane, ear canal and external ear normal.     Nose: Nose normal.     Mouth/Throat:     Mouth: Mucous membranes are moist.     Pharynx: Oropharynx is clear. No oropharyngeal exudate.  Eyes:     General:  No scleral icterus.    Conjunctiva/sclera: Conjunctivae normal.     Pupils: Pupils are equal, round, and reactive to light.  Neck:     Thyroid: No thyromegaly.  Cardiovascular:     Rate and Rhythm: Normal rate and regular rhythm.     Heart sounds: Normal heart sounds. No murmur heard. Pulmonary:     Effort: Pulmonary effort is normal. No respiratory distress.     Breath sounds: Normal breath sounds. No wheezing or rales.  Abdominal:     General: There is no distension.     Palpations: Abdomen is soft.     Tenderness: There is no abdominal tenderness.  Musculoskeletal:        General: No deformity.     Cervical back: Neck supple.     Right lower leg: No edema.     Left lower leg: No edema.  Lymphadenopathy:     Cervical: No cervical adenopathy.   Skin:    General: Skin is warm and dry.     Findings: No rash.  Neurological:     Mental Status: She is alert and oriented to person, place, and time. Mental status is at baseline.     Gait: Gait normal.  Psychiatric:        Mood and Affect: Mood normal.        Behavior: Behavior normal.        Thought Content: Thought content normal.      No results found for any visits on 04/16/23.  Assessment & Plan    Routine Health Maintenance and Physical Exam  Exercise Activities and Dietary recommendations  Goals   None     Immunization History  Administered Date(s) Administered   PFIZER(Purple Top)SARS-COV-2 Vaccination 12/07/2019, 12/28/2019    Health Maintenance  Topic Date Due   DTaP/Tdap/Td (1 - Tdap) Never done   Zoster Vaccines- Shingrix (1 of 2) Never done   COVID-19 Vaccine (3 - Pfizer risk series) 01/25/2020   MAMMOGRAM  04/08/2022   INFLUENZA VACCINE  06/24/2023 (Originally 10/25/2022)   Fecal DNA (Cologuard)  04/08/2024   Cervical Cancer Screening (HPV/Pap Cotest)  11/30/2026   Hepatitis C Screening  Completed   HIV Screening  Completed   HPV VACCINES  Aged Out    Discussed health benefits of physical activity, and encouraged her to engage in regular exercise appropriate for her age and condition.  Problem List Items Addressed This Visit       Other   Mixed hyperlipidemia   Hyperlipidemia is managed with Crestor. The patient has made significant lifestyle changes, including weight loss, dietary modifications, and reduced intake of sugary beverages. Awaiting lab results to determine if Crestor can be reduced or discontinued. Discussed the potential genetic component of hyperlipidemia and the possibility of needing to continue Crestor despite lifestyle changes. - Order cholesterol panel - Review lab results and adjust Crestor as needed      Relevant Orders   Comprehensive metabolic panel   Lipid panel   Other Visit Diagnoses       Encounter for annual  physical exam    -  Primary   Relevant Orders   Hemoglobin A1c   Comprehensive metabolic panel   Lipid panel     Hyperglycemia       Relevant Orders   Hemoglobin A1c          Hypertension Hypertension is well-controlled with lifestyle modifications. The patient has not been taking hydrochlorothiazide and reports excellent blood pressure readings. Discontinuing hydrochlorothiazide was discussed  and agreed upon. - Discontinue hydrochlorothiazide  Menopause Menopausal symptoms are improving, potentially due to lifestyle changes such as weight loss and dietary modifications. The patient reports fewer hot flashes. Discussed the impact of lifestyle changes on menopausal symptoms. - Continue current lifestyle modifications  General Health Maintenance Routine health maintenance is up to date except for a mammogram. The patient declined a repeat mammogram despite understanding the importance of breast cancer screening. Other screenings and vaccinations are current or have been recently completed. Discussed the importance of regular mammograms regardless of breast size. - Repeat mammogram declined - Cologuard due in 2026 - Pap smear due in 2028 - Confirm tetanus shot status - Shingles vaccine declined - No flu shot  Follow-up - Schedule next year's physical - Complete labs today - Review lab results and follow up as needed.       Return in about 1 year (around 04/15/2024) for CPE.     Shirlee Latch, MD  [facility] [phone] (phone) [phone] (fax)  [facility]

## 2023-04-16 NOTE — Patient Instructions (Signed)
Office Fax [phone]

## 2023-04-16 NOTE — Assessment & Plan Note (Signed)
Hyperlipidemia is managed with Crestor. The patient has made significant lifestyle changes, including weight loss, dietary modifications, and reduced intake of sugary beverages. Awaiting lab results to determine if Crestor can be reduced or discontinued. Discussed the potential genetic component of hyperlipidemia and the possibility of needing to continue Crestor despite lifestyle changes. - Order cholesterol panel - Review lab results and adjust Crestor as needed

## 2023-04-17 LAB — COMPREHENSIVE METABOLIC PANEL
ALT: 17 [IU]/L (ref 0–32)
AST: 16 [IU]/L (ref 0–40)
Albumin: 4.1 g/dL (ref 3.8–4.9)
Alkaline Phosphatase: 75 [IU]/L (ref 44–121)
BUN/Creatinine Ratio: 16 (ref 9–23)
BUN: 19 mg/dL (ref 6–24)
Bilirubin Total: <0.2 mg/dL (ref 0.0–1.2)
CO2: 24 mmol/L (ref 20–29)
Calcium: 9.7 mg/dL (ref 8.7–10.2)
Chloride: 104 mmol/L (ref 96–106)
Creatinine, Ser: 1.17 mg/dL — ABNORMAL HIGH (ref 0.57–1.00)
Globulin, Total: 2.2 g/dL (ref 1.5–4.5)
Glucose: 86 mg/dL (ref 70–99)
Potassium: 4.4 mmol/L (ref 3.5–5.2)
Sodium: 143 mmol/L (ref 134–144)
Total Protein: 6.3 g/dL (ref 6.0–8.5)
eGFR: 56 mL/min/{1.73_m2} — ABNORMAL LOW (ref >59)

## 2023-04-17 LAB — LIPID PANEL
Chol/HDL Ratio: 3.7 {ratio} (ref 0.0–4.4)
Cholesterol, Total: 181 mg/dL (ref 100–199)
HDL: 49 mg/dL (ref >39)
LDL Chol Calc (NIH): 107 mg/dL — ABNORMAL HIGH (ref 0–99)
Triglycerides: 141 mg/dL (ref 0–149)
VLDL Cholesterol Cal: 25 mg/dL (ref 5–40)

## 2023-04-17 LAB — HEMOGLOBIN A1C
Est. average glucose Bld gHb Est-mCnc: 108 mg/dL
Hgb A1c MFr Bld: 5.4 % (ref 4.8–5.6)

## 2023-04-18 ENCOUNTER — Other Ambulatory Visit: Payer: Self-pay

## 2023-04-18 ENCOUNTER — Encounter: Payer: Self-pay | Admitting: Family Medicine

## 2023-04-18 DIAGNOSIS — N289 Disorder of kidney and ureter, unspecified: Secondary | ICD-10-CM

## 2023-05-02 ENCOUNTER — Ambulatory Visit: Payer: Self-pay | Admitting: Family Medicine

## 2023-05-02 ENCOUNTER — Telehealth: Payer: Self-pay | Admitting: Family Medicine

## 2023-05-02 NOTE — Telephone Encounter (Signed)
 Patient had a 8am appt scheduled for today and arrived for it at 8:12am. I kindly let her know that she was past her 10 min grace period and we needed to reschedule and she got very upset and said she has kidney issues and should not be turned away over 2 mins and ask that I bring Dr. B to the front to speak to her. She told me it wasn't my place to turn patients away over being 2 mins late when she was actually 12 mins late. She stated that I should check with providers and management first before I do that. I stated to her that is our policy and I advised that I could not bring the provider to the front but speak to her in the back on her behalf and when I did Dr. B confirmed that she needed to reschedule or see someone else or have a visit at urgent care. Patient ended up rescheduling for the following day but was not pleased about it.

## 2023-05-03 ENCOUNTER — Encounter: Payer: Self-pay | Admitting: Family Medicine

## 2023-05-03 ENCOUNTER — Ambulatory Visit (INDEPENDENT_AMBULATORY_CARE_PROVIDER_SITE_OTHER): Payer: No Typology Code available for payment source | Admitting: Family Medicine

## 2023-05-03 VITALS — BP 97/76 | HR 74 | Resp 16 | Ht 66.0 in | Wt 143.6 lb

## 2023-05-03 DIAGNOSIS — Z8249 Family history of ischemic heart disease and other diseases of the circulatory system: Secondary | ICD-10-CM

## 2023-05-03 DIAGNOSIS — R7989 Other specified abnormal findings of blood chemistry: Secondary | ICD-10-CM | POA: Diagnosis not present

## 2023-05-03 DIAGNOSIS — E538 Deficiency of other specified B group vitamins: Secondary | ICD-10-CM | POA: Diagnosis not present

## 2023-05-03 DIAGNOSIS — E782 Mixed hyperlipidemia: Secondary | ICD-10-CM

## 2023-05-03 NOTE — Progress Notes (Signed)
      Established patient visit   Patient: Kayla Alexander   DOB: 1970-08-30   53 y.o. Female  MRN: [id number] Visit Date: 05/03/2023  Today's healthcare provider: Nancyann Perry, MD   Chief Complaint  Patient presents with   Medical Management of Chronic Issues    Lab follow-up.   Subjective    HPI Follow up elevated creatinine on CPE labs done two weeks ago. Also follow up lipids, off rosuvastatin  for several months, and b12, off b12 shots about 3 months. Has been eating much healthier and exercising much more the last several months. No NSAIDs. Drinking 6-8 glasses of water every day and occasional Pepsi Free. Has felt more sluggish since stopping B12 shots.  Lab Results  Component Value Date   NA 143 04/16/2023   K 4.4 04/16/2023   CREATININE 1.17 (H) 04/16/2023   EGFR 56 (L) 04/16/2023   GLUCOSE 86 04/16/2023   Lab Results  Component Value Date   CHOL 181 04/16/2023   HDL 49 04/16/2023   LDLCALC 107 (H) 04/16/2023   TRIG 141 04/16/2023   CHOLHDL 3.7 04/16/2023   Lab Results  Component Value Date   VITAMINB12 295 11/01/2022   The 10-year ASCVD risk score (Arnett DK, et al., 2019) is: 0.9%   Medications: Outpatient Medications Prior to Visit  Medication Sig   cetirizine  (ZYRTEC ) 10 MG tablet Take 1 tablet (10 mg total) by mouth daily.   fluticasone  (FLONASE ) 50 MCG/ACT nasal spray Place 2 sprays into both nostrils daily.   rosuvastatin  (CRESTOR ) 10 MG tablet Take 1 tablet (10 mg total) by mouth daily. (Patient not taking: Reported on 05/03/2023)   SYRINGE-NEEDLE, DISP, 3 ML 22G X 1-1/2 3 ML MISC 1 each by Does not apply route as directed. (Patient not taking: Reported on 05/03/2023)   No facility-administered medications prior to visit.      Objective    BP 97/76 (BP Location: Left Arm, Patient Position: Sitting, Cuff Size: Normal)   Pulse 74   Resp 16   Ht 5' 6 (1.676 m)   Wt 143 lb 9.6 oz (65.1 kg)   BMI 23.18 kg/m    Physical Exam  General appearance:  Well developed, well nourished female, cooperative and in no acute distress Head: Normocephalic, without obvious abnormality, atraumatic Respiratory: Respirations even and unlabored, normal respiratory rate Extremities: All extremities are intact.  Skin: Skin color, texture, turgor normal. No rashes seen  Psych: Appropriate mood and affect. Neurologic: Mental status: Alert, oriented to person, place, and time, thought content appropriate.   Assessment & Plan     1. Elevated serum creatinine (Primary) May be related to significantly increasing exercise. Is drinking plenty of water and avoiding NSAIDs.   - Renal function panel  2. B12 deficiency Had been on B12 injections, but now feeling much more sluggish since stopping them a few months ago.  - Vitamin B12  She is willing to try oral B12 supplements after reviewing labs.   3. Family history of MI (myocardial infarction)   4. Mixed hyperlipidemia Lipids well controlled since eating healthier despite stopping statin.  Considering family history will plan on checking cardiac markers with next lipids        Nancyann Perry, MD  [facility] [phone] (phone) [phone] (fax)  [facility]

## 2023-05-04 LAB — RENAL FUNCTION PANEL
Albumin: 4.4 g/dL (ref 3.8–4.9)
BUN/Creatinine Ratio: 16 (ref 9–23)
BUN: 14 mg/dL (ref 6–24)
CO2: 22 mmol/L (ref 20–29)
Calcium: 9.4 mg/dL (ref 8.7–10.2)
Chloride: 106 mmol/L (ref 96–106)
Creatinine, Ser: 0.87 mg/dL (ref 0.57–1.00)
Glucose: 89 mg/dL (ref 70–99)
Phosphorus: 3.8 mg/dL (ref 3.0–4.3)
Potassium: 4.6 mmol/L (ref 3.5–5.2)
Sodium: 144 mmol/L (ref 134–144)
eGFR: 80 mL/min/{1.73_m2} (ref >59)

## 2023-05-04 LAB — VITAMIN B12: Vitamin B-12: 399 pg/mL (ref 232–1245)

## 2023-05-05 ENCOUNTER — Encounter: Payer: Self-pay | Admitting: Family Medicine

## 2023-06-10 ENCOUNTER — Ambulatory Visit: Payer: No Typology Code available for payment source | Admitting: Dermatology

## 2023-06-10 ENCOUNTER — Encounter: Payer: Self-pay | Admitting: Dermatology

## 2023-06-10 DIAGNOSIS — W908XXA Exposure to other nonionizing radiation, initial encounter: Secondary | ICD-10-CM | POA: Diagnosis not present

## 2023-06-10 DIAGNOSIS — L988 Other specified disorders of the skin and subcutaneous tissue: Secondary | ICD-10-CM

## 2023-06-10 DIAGNOSIS — L570 Actinic keratosis: Secondary | ICD-10-CM

## 2023-06-10 DIAGNOSIS — Z85828 Personal history of other malignant neoplasm of skin: Secondary | ICD-10-CM

## 2023-06-10 DIAGNOSIS — L578 Other skin changes due to chronic exposure to nonionizing radiation: Secondary | ICD-10-CM | POA: Diagnosis not present

## 2023-06-10 DIAGNOSIS — Z872 Personal history of diseases of the skin and subcutaneous tissue: Secondary | ICD-10-CM

## 2023-06-10 NOTE — Patient Instructions (Addendum)
 OTC Differin (adapalene) 0.1% gel, pea-size amount Topical retinoid medications like tretinoin/Retin-A, adapalene/Differin, tazarotene/Fabior, and Epiduo/Epiduo Forte can cause dryness and irritation when first started. Only apply a pea-sized amount to the entire affected area. Avoid applying it around the eyes, edges of mouth and creases at the nose. If you experience irritation, use a good moisturizer first and/or apply the medicine less often. If you are doing well with the medicine, you can increase how often you use it until you are applying every night. Be careful with sun protection while using this medication as it can make you sensitive to the sun. This medicine should not be used by pregnant women.     Cryotherapy Aftercare  Wash gently with soap and water everyday.   Apply Vaseline and Band-Aid daily until healed.     Due to recent changes in healthcare laws, you may see results of your pathology and/or laboratory studies on MyChart before the doctors have had a chance to review them. We understand that in some cases there may be results that are confusing or concerning to you. Please understand that not all results are received at the same time and often the doctors may need to interpret multiple results in order to provide you with the best plan of care or course of treatment. Therefore, we ask that you please give Kayla Alexander 2 business days to thoroughly review all your results before contacting the office for clarification. Should we see a critical lab result, you will be contacted sooner.   If You Need Anything After Your Visit  If you have any questions or concerns for your doctor, please call our main line at [phone] and press option 4 to reach your doctor's medical assistant. If no one answers, please leave a voicemail as directed and we will return your call as soon as possible. Messages left after 4 pm will be answered the following business day.   You may also send Kayla Alexander a message  via MyChart. We typically respond to MyChart messages within 1-2 business days.  For prescription refills, please ask your pharmacy to contact our office. Our fax number is [phone].  If you have an urgent issue when the clinic is closed that cannot wait until the next business day, you can page your doctor at the number below.    Please note that while we do our best to be available for urgent issues outside of office hours, we are not available 24/7.   If you have an urgent issue and are unable to reach Kayla Alexander, you may choose to seek medical care at your doctor's office, retail clinic, urgent care center, or emergency room.  If you have a medical emergency, please immediately call 911 or go to the emergency department.  Pager Numbers  - Dr. Gwen Pounds: [phone]  - Dr. Roseanne Reno: [phone]  - Dr. Katrinka Blazing: [phone]   In the event of inclement weather, please call our main line at [phone] for an update on the status of any delays or closures.  Dermatology Medication Tips: Please keep the boxes that topical medications come in in order to help keep track of the instructions about where and how to use these. Pharmacies typically print the medication instructions only on the boxes and not directly on the medication tubes.   If your medication is too expensive, please contact our office at [phone] option 4 or send Kayla Alexander a message through MyChart.   We are unable to tell what your co-pay for medications will be in advance  as this is different depending on your insurance coverage. However, we may be able to find a substitute medication at lower cost or fill out paperwork to get insurance to cover a needed medication.   If a prior authorization is required to get your medication covered by your insurance company, please allow Kayla Alexander 1-2 business days to complete this process.  Drug prices often vary depending on where the prescription is filled and some pharmacies may offer cheaper  prices.  The website www.goodrx.com contains coupons for medications through different pharmacies. The prices here do not account for what the cost may be with help from insurance (it may be cheaper with your insurance), but the website can give you the price if you did not use any insurance.  - You can print the associated coupon and take it with your prescription to the pharmacy.  - You may also stop by our office during regular business hours and pick up a GoodRx coupon card.  - If you need your prescription sent electronically to a different pharmacy, notify our office through [facility] or by phone at [phone] option 4.     Si Usted Necesita Algo Despus de Su Visita  Tambin puede enviarnos un mensaje a travs de Clinical cytogeneticist. Por lo general respondemos a los mensajes de MyChart en el transcurso de 1 a 2 das hbiles.  Para renovar recetas, por favor pida a su farmacia que se ponga en contacto con nuestra oficina. Annie Sable de fax es [place] [phone].  Si tiene un asunto urgente cuando la clnica est cerrada y que no puede esperar hasta el siguiente da hbil, puede llamar/localizar a su doctor(a) al nmero que aparece a continuacin.   Por favor, tenga en cuenta que aunque hacemos todo lo posible para estar disponibles para asuntos urgentes fuera del horario de [place], no estamos disponibles las 24 horas del da, los 7 [address]  [address] de la [place].   Si tiene un problema urgente y no puede comunicarse con nosotros, puede optar por buscar atencin mdica  en el consultorio de su doctor(a), en una clnica privada, en un centro de atencin urgente o en una sala de emergencias.  Si tiene Engineer, drilling, por favor llame inmediatamente al 911 o vaya a la sala de emergencias.  Nmeros de bper  - Dr. Gwen Pounds: [phone]  - Dra. Roseanne Reno: [phone]  - Dr. Katrinka Blazing: [phone]   En caso de inclemencias del tiempo, por favor llame a Lacy Duverney principal al [phone]  para una actualizacin sobre el [place] de cualquier retraso o cierre.  Consejos para la medicacin en dermatologa: Por favor, guarde las cajas en las que vienen los medicamentos de uso tpico para ayudarle a seguir las instrucciones sobre dnde y cmo usarlos. Las farmacias generalmente imprimen las instrucciones del medicamento slo en las cajas y no directamente en los tubos del [place].   Si su medicamento es muy caro, por favor, pngase en contacto con Rolm Gala llamando al [phone] y presione la opcin 4 o envenos un mensaje a travs de Clinical cytogeneticist.   No podemos decirle cul ser su copago por los medicamentos por adelantado ya que esto es diferente dependiendo de la cobertura de su seguro. Sin embargo, es posible que podamos encontrar un medicamento sustituto a Audiological scientist un formulario para que el seguro cubra el medicamento que se considera necesario.   Si se requiere una autorizacin previa para que su compaa de seguros [place] su medicamento, por favor permtanos de 1 a 2  das hbiles para completar [place].  Los precios de los medicamentos varan con frecuencia dependiendo del Environmental consultant de dnde se surte la receta y alguna farmacias pueden ofrecer precios ms baratos.  El sitio web www.goodrx.com tiene cupones para medicamentos de Health and safety inspector. Los precios aqu no tienen en cuenta lo que podra costar con la ayuda del seguro (puede ser ms barato con su seguro), pero el sitio web puede darle el precio si no utiliz Tourist information centre manager.  - Puede imprimir el cupn correspondiente y llevarlo con su receta a la farmacia.  - Tambin puede pasar por nuestra oficina durante el horario de atencin regular y Education officer, museum una tarjeta de cupones de GoodRx.  - Si necesita que su receta se enve electrnicamente a una farmacia diferente, informe a nuestra oficina a travs de MyChart de [place] o por telfono llamando al [phone] y presione la opcin 4.

## 2023-06-10 NOTE — Progress Notes (Signed)
 Follow-Up Visit   Subjective  Kayla Alexander is a 53 y.o. female who presents for the following: AK follow-up nose dorsum and L lower cutaneous lip that have resolved. Pt does have place at L hand dorsum she noticed x1 month ago.  The patient has spots, moles and lesions to be evaluated, some may be new or changing and the patient may have concern these could be cancer.   The following portions of the chart were reviewed this encounter and updated as appropriate: medications, allergies, medical history  Review of Systems:  No other skin or systemic complaints except as noted in HPI or Assessment and Plan.  Objective  Well appearing patient in no apparent distress; mood and affect are within normal limits.   A focused examination was performed of the following areas: Face, L hand, back, chest  Relevant exam findings are noted in the Assessment and Plan.  chest x7, L dorsal hand x1 (8) Pink scaly macules  Assessment & Plan   HISTORY OF PRECANCEROUS ACTINIC KERATOSIS - site(s) of PreCancerous Actinic Keratosis clear today at nose dorsum and L lower cutaneous lip - these may recur and new lesions may form requiring treatment to prevent transformation into skin cancer - observe for new or changing spots and contact [facility] for appointment if occur - photoprotection with sun protective clothing; sunglasses and broad spectrum sunscreen with SPF of at least 30 + and frequent self skin exams recommended - yearly exams by a dermatologist recommended for persons with history of PreCancerous Actinic Keratoses   HISTORY OF BASAL CELL CARCINOMA OF THE SKIN R sup med upper back- 08/10/2015 - No evidence of recurrence today - Recommend regular full body skin exams - Recommend daily broad spectrum sunscreen SPF 30+ to sun-exposed areas, reapply every 2 hours as needed.  - Call if any new or changing lesions are noted between office visits   FACIAL ELASTOSIS Exam: Rhytides on  upper cutaneous lip  Treatment Plan: For fine lines at upper lip: OTC Differin (adapalene) 0.1% gel every other day and work up to daily, use pea-size amount. Samples given to patient.   Topical retinoid medications like tretinoin/Retin-A, adapalene/Differin, tazarotene/Fabior, and Epiduo/Epiduo Forte can cause dryness and irritation when first started. Only apply a pea-sized amount to the entire affected area. Avoid applying it around the eyes, edges of mouth and creases at the nose. If you experience irritation, use a good moisturizer first and/or apply the medicine less often. If you are doing well with the medicine, you can increase how often you use it until you are applying every night. Be careful with sun protection while using this medication as it can make you sensitive to the sun. This medicine should not be used by pregnant women.   Recommend daily broad spectrum sunscreen SPF 30+ to sun-exposed areas, reapply every 2 hours as needed. Call for new or changing lesions.  Staying in the shade or wearing long sleeves, sun glasses (UVA+UVB protection) and wide brim hats (4-inch brim around the entire circumference of the hat) are also recommended for sun protection.     AK (ACTINIC KERATOSIS) (8) chest x7, L dorsal hand x1 (8) Actinic keratoses are precancerous spots that appear secondary to cumulative UV radiation exposure/sun exposure over time. They are chronic with expected duration over 1 year. A portion of actinic keratoses will progress to squamous cell carcinoma of the skin. It is not possible to reliably predict which spots will progress to skin cancer and so treatment  is recommended to prevent development of skin cancer.  Recommend daily broad spectrum sunscreen SPF 30+ to sun-exposed areas, reapply every 2 hours as needed.  Recommend staying in the shade or wearing long sleeves, sun glasses (UVA+UVB protection) and wide brim hats (4-inch brim around the entire circumference of the  hat). Call for new or changing lesions. Destruction of lesion - chest x7, L dorsal hand x1 (8) Complexity: simple   Destruction method: cryotherapy   Informed consent: discussed and consent obtained   Timeout:  patient name, date of birth, surgical site, and procedure verified Lesion destroyed using liquid nitrogen: Yes   Region frozen until ice ball extended beyond lesion: Yes   Cryo cycles: 1 or 2. Outcome: patient tolerated procedure well with no complications   Post-procedure details: wound care instructions given   ACTINIC ELASTOSIS    Return in about 4 months (around 10/10/2023) for TBSE, Hx BCC, w/ Dr. Katrinka Blazing.  Wynonia Lawman, CMA, am acting as scribe for Elie Goody, MD .   Documentation: I have reviewed the above documentation for accuracy and completeness, and I agree with the above.  Elie Goody, MD

## 2023-06-17 ENCOUNTER — Telehealth: Payer: Self-pay | Admitting: Family Medicine

## 2023-06-17 MED ORDER — FLUTICASONE PROPIONATE 50 MCG/ACT NA SUSP: 2.0000 | Freq: Every day | NASAL | 6 refills | Status: AC

## 2023-06-17 NOTE — Telephone Encounter (Signed)
[facility] faxed refill request for the following medications:   fluticasone (FLONASE) 50 MCG/ACT nasal spray    Please advise.

## 2023-06-17 NOTE — Telephone Encounter (Signed)
 refilled

## 2023-06-20 ENCOUNTER — Ambulatory Visit (INDEPENDENT_AMBULATORY_CARE_PROVIDER_SITE_OTHER): Admitting: Dermatology

## 2023-06-20 ENCOUNTER — Encounter: Payer: Self-pay | Admitting: Family Medicine

## 2023-06-20 DIAGNOSIS — L988 Other specified disorders of the skin and subcutaneous tissue: Secondary | ICD-10-CM | POA: Diagnosis not present

## 2023-06-20 DIAGNOSIS — F17210 Nicotine dependence, cigarettes, uncomplicated: Secondary | ICD-10-CM | POA: Diagnosis not present

## 2023-06-20 DIAGNOSIS — L814 Other melanin hyperpigmentation: Secondary | ICD-10-CM

## 2023-06-20 NOTE — Patient Instructions (Addendum)

## 2023-06-20 NOTE — Progress Notes (Signed)
   Follow-Up Visit   Subjective  Kayla Alexander is a 53 y.o. female who presents for the following: Discuss "smokers lines", pt not interested in Botox, but would like to discuss other treatment options. She does smoke at night when she gets home from work. Pt perimenopausal and has lost about 20 lbs in the past 6 months.   The following portions of the chart were reviewed this encounter and updated as appropriate: medications, allergies, medical history  Review of Systems:  No other skin or systemic complaints except as noted in HPI or Assessment and Plan.  Objective  Well appearing patient in no apparent distress; mood and affect are within normal limits.  A focused examination was performed of the following areas: the face  Relevant exam findings are noted in the Assessment and Plan.   Assessment & Plan   LENTIGINES Exam: scattered tan macules face Due to sun exposure Treatment Plan: Benign-appearing, observe. Recommend daily broad spectrum sunscreen SPF 30+ to sun-exposed areas, reapply every 2 hours as needed.  Call for any changes   FACIAL ELASTOSIS Exam: Rhytides and volume loss perioral  Treatment Plan: Discussed Botox, but patient defers at this time.   Continue OTC Adapalene 0.1% cream QHS. Topical retinoid medications like tretinoin/Retin-A, adapalene/Differin, tazarotene/Fabior, and Epiduo/Epiduo Forte can cause dryness and irritation when first started. Only apply a pea-sized amount to the entire affected area. Avoid applying it around the eyes, edges of mouth and creases at the nose. If you experience irritation, use a good moisturizer first and/or apply the medicine less often. If you are doing well with the medicine, you can increase how often you use it until you are applying every night. Be careful with sun protection while using this medication as it can make you sensitive to the sun. This medicine should not be used by pregnant women.   Discussed lip fillers into  the lips and "smoker's lines". Recommend one syringe of Restylane Refyne at $650. Do not recommend not smoking for at least two days after procedure. Will last around 6 months.  Discussed laser resurfacing which has longer lasting effects, recommend Dr. Geraldine Solar Cox or Dr. Sallye Lat.   Recommend daily broad spectrum sunscreen SPF 30+ to sun-exposed areas, reapply every 2 hours as needed. Call for new or changing lesions.   Staying in the shade or wearing long sleeves, sun glasses (UVA+UVB protection) and wide brim hats (4-inch brim around the entire circumference of the hat) are also recommended for sun protection.    Return for lip filler x 1 syringe of Restylane Refyne.  Maylene Roes, CMA, am acting as scribe for Willeen Niece, MD .  Documentation: I have reviewed the above documentation for accuracy and completeness, and I agree with the above.  Willeen Niece, MD

## 2023-07-03 ENCOUNTER — Encounter: Payer: Self-pay | Admitting: Dermatology

## 2023-07-03 MED ORDER — ADAPALENE 0.3 % EX GEL: CUTANEOUS | 11 refills | Status: AC

## 2023-07-24 ENCOUNTER — Encounter: Payer: Self-pay | Admitting: Family Medicine

## 2023-07-26 ENCOUNTER — Ambulatory Visit: Admitting: Family Medicine

## 2023-07-26 ENCOUNTER — Encounter: Payer: Self-pay | Admitting: Family Medicine

## 2023-07-26 VITALS — BP 102/80 | HR 76 | Resp 16 | Wt 142.0 lb

## 2023-07-26 DIAGNOSIS — R1011 Right upper quadrant pain: Secondary | ICD-10-CM

## 2023-07-26 DIAGNOSIS — R110 Nausea: Secondary | ICD-10-CM

## 2023-07-26 NOTE — Progress Notes (Unsigned)
 Established patient visit   Patient: Kayla Alexander   DOB: 06/25/1970   53 y.o. Female  MRN: [id number] Visit Date: 07/26/2023  Today's healthcare provider: Jeralene Mom, MD   Chief Complaint  Patient presents with   Dizziness   Nausea   Subjective    Discussed the use of AI scribe software for clinical note transcription with the patient, who gave verbal consent to proceed.  History of Present Illness   Kayla Alexander is a 53 year old female who presents with nausea and right-sided abdominal pain.  She experiences daily nausea, primarily at night and in the mornings, which has persisted for several months. The nausea is not severe enough to cause vomiting and does not interfere with her daily activities, such as exercising at the [facility].  She reports right-sided abdominal pain that began in the past week or two. The pain was severe on two occasions, lasting 30 minutes to an hour, but she was able to find a comfortable position to alleviate it. She has modified her diet since September, avoiding fried foods and drive-through meals. However, she experienced significant stomach discomfort after consuming a homemade hamburger with cheese and chili, which she attributes to her reduced tolerance for greasy foods.  She has a history of anxiety and panic attacks in her thirties, which have subsided until recently when she began feeling anxious again. She wonders if her current symptoms could be related to anxiety or menopause, as she has not had a period since undergoing endometrial ablation at age 51 due to heavy bleeding and anemia.  She has not had a hysterectomy and had her tubes tied 27 years ago, eliminating the possibility of pregnancy.       Medications: Outpatient Medications Prior to Visit  Medication Sig   Adapalene  (DIFFERIN ) 0.3 % gel Apply a pea sized amount to the entire face QHS.   cetirizine  (ZYRTEC ) 10 MG tablet Take 1 tablet (10 mg total) by mouth daily.    fluticasone  (FLONASE ) 50 MCG/ACT nasal spray Place 2 sprays into both nostrils daily.   No facility-administered medications prior to visit.   Review of Systems {Insert previous labs (optional):23779} {See past labs  Heme  Chem  Endocrine  Serology  Results Review (optional):1}   Objective    BP 102/80 (BP Location: Left Arm, Patient Position: Sitting, Cuff Size: Normal)   Pulse 76   Resp 16   Wt 142 lb (64.4 kg)   SpO2 99%   BMI 22.92 kg/m {Insert last BP/Wt (optional):23777}{See vitals history (optional):1}  Physical Exam   General appearance: Well developed, well nourished female, cooperative and in no acute distress Head: Normocephalic, without obvious abnormality, atraumatic Respiratory: Respirations even and unlabored, normal respiratory rate Extremities: All extremities are intact.  Skin: Skin color, texture, turgor normal. No rashes seen  Psych: Appropriate mood and affect. Neurologic: Mental status: Alert, oriented to person, place, and time, thought content appropriate.    Assessment & Plan        Right-sided abdominal pain Intermittent severe right-sided abdominal pain, possibly related to gallbladder issues. Differential includes gallbladder disease, possibly gallstones. - Order abdominal ultrasound to evaluate liver and gallbladder. - Order blood work to assess liver, gallbladder, and pancreatic function.  Nausea Chronic daily nausea, possibly related to gallbladder issues or anxiety. Prefers non-medication management unless necessary. - Offer antiemetic medication such as Zofran or Phenergan if needed.         Jeralene Mom, MD  [facility]  [phone] (phone) [phone] (fax)  [facility]

## 2023-07-27 LAB — COMPREHENSIVE METABOLIC PANEL WITH GFR
ALT: 17 IU/L (ref 0–32)
AST: 21 IU/L (ref 0–40)
Albumin: 4.4 g/dL (ref 3.8–4.9)
Alkaline Phosphatase: 77 IU/L (ref 44–121)
BUN/Creatinine Ratio: 13 (ref 9–23)
BUN: 11 mg/dL (ref 6–24)
Bilirubin Total: 0.4 mg/dL (ref 0.0–1.2)
CO2: 23 mmol/L (ref 20–29)
Calcium: 9.6 mg/dL (ref 8.7–10.2)
Chloride: 105 mmol/L (ref 96–106)
Creatinine, Ser: 0.88 mg/dL (ref 0.57–1.00)
Globulin, Total: 2.1 g/dL (ref 1.5–4.5)
Glucose: 85 mg/dL (ref 70–99)
Potassium: 4.4 mmol/L (ref 3.5–5.2)
Sodium: 141 mmol/L (ref 134–144)
Total Protein: 6.5 g/dL (ref 6.0–8.5)
eGFR: 79 mL/min/{1.73_m2} (ref >59)

## 2023-07-27 LAB — CBC
Hematocrit: 44.1 % (ref 34.0–46.6)
Hemoglobin: 14.4 g/dL (ref 11.1–15.9)
MCH: 31.4 pg (ref 26.6–33.0)
MCHC: 32.7 g/dL (ref 31.5–35.7)
MCV: 96 fL (ref 79–97)
Platelets: 256 10*3/uL (ref 150–450)
RBC: 4.59 x10E6/uL (ref 3.77–5.28)
RDW: 12.2 % (ref 11.7–15.4)
WBC: 4.8 10*3/uL (ref 3.4–10.8)

## 2023-07-27 LAB — AMYLASE: Amylase: 28 U/L — ABNORMAL LOW (ref 31–110)

## 2023-07-29 ENCOUNTER — Encounter: Payer: Self-pay | Admitting: Family Medicine

## 2023-08-01 ENCOUNTER — Ambulatory Visit
Admission: RE | Admit: 2023-08-01 | Discharge: 2023-08-01 | Disposition: A | Discharge status: Home / Self Care | Source: Ambulatory Visit | Attending: Family Medicine | Admitting: Family Medicine

## 2023-08-01 DIAGNOSIS — R1011 Right upper quadrant pain: Secondary | ICD-10-CM

## 2023-08-08 ENCOUNTER — Encounter: Payer: Self-pay | Admitting: Dermatology

## 2023-08-08 ENCOUNTER — Ambulatory Visit (INDEPENDENT_AMBULATORY_CARE_PROVIDER_SITE_OTHER): Admitting: Dermatology

## 2023-08-08 DIAGNOSIS — D492 Neoplasm of unspecified behavior of bone, soft tissue, and skin: Secondary | ICD-10-CM | POA: Diagnosis not present

## 2023-08-08 DIAGNOSIS — W57XXXA Bitten or stung by nonvenomous insect and other nonvenomous arthropods, initial encounter: Secondary | ICD-10-CM

## 2023-08-08 DIAGNOSIS — D239 Other benign neoplasm of skin, unspecified: Secondary | ICD-10-CM

## 2023-08-08 DIAGNOSIS — D2271 Melanocytic nevi of right lower limb, including hip: Secondary | ICD-10-CM

## 2023-08-08 DIAGNOSIS — L905 Scar conditions and fibrosis of skin: Secondary | ICD-10-CM | POA: Diagnosis not present

## 2023-08-08 DIAGNOSIS — S40861A Insect bite (nonvenomous) of right upper arm, initial encounter: Secondary | ICD-10-CM | POA: Diagnosis not present

## 2023-08-08 HISTORY — DX: Other benign neoplasm of skin, unspecified: D23.9

## 2023-08-08 NOTE — Patient Instructions (Signed)

## 2023-08-08 NOTE — Progress Notes (Signed)
   Follow-Up Visit   Subjective  Kayla Alexander is a 53 y.o. female who presents for the following: mole on right lateral buttocks. She states the mole has been there almost all of her life but the pink area may be new.   The patient has spots, moles and lesions to be evaluated, some may be new or changing and the patient may have concern these could be cancer.    The following portions of the chart were reviewed this encounter and updated as appropriate: medications, allergies, medical history  Review of Systems:  No other skin or systemic complaints except as noted in HPI or Assessment and Plan.  Objective  Well appearing patient in no apparent distress; mood and affect are within normal limits.  A focused examination was performed of the following areas: Right buttock/hip  Relevant physical exam findings are noted in the Assessment and Plan.  Right Hip 11 mm pink plaque with 2 mm focal brown pigmentation  Right Upper Arm Two erythematous indurated papules with crusted erosions  Assessment & Plan    NEOPLASM OF SKIN Right Hip Skin / nail biopsy Type of biopsy: tangential   Informed consent: discussed and consent obtained   Timeout: patient name, date of birth, surgical site, and procedure verified   Procedure prep:  Patient was prepped and draped in usual sterile fashion Prep type:  Isopropyl alcohol Anesthesia: the lesion was anesthetized in a standard fashion   Anesthetic:  1% lidocaine w/ epinephrine 1-100,000 buffered w/ 8.4% NaHCO3 Instrument used: DermaBlade   Hemostasis achieved with: pressure and aluminum chloride   Outcome: patient tolerated procedure well   Post-procedure details: sterile dressing applied and wound care instructions given   Dressing type: bandage and petrolatum   Specimen 1 - Surgical pathology Differential Diagnosis: dysplastic nevus vs melanoma  Check Margins: No BUG BITE WITHOUT INFECTION, INITIAL ENCOUNTER Right Upper Arm Recommend  applying Aquaphor or Vaseline Jelly daily   Return for TBSE As Scheduled, With Dr. Felipe Horton.  I, Jill Parcell, CMA, am acting as scribe for Harris Liming, MD.   Documentation: I have reviewed the above documentation for accuracy and completeness, and I agree with the above.  Harris Liming, MD

## 2023-08-13 LAB — SURGICAL PATHOLOGY

## 2023-08-14 ENCOUNTER — Encounter: Payer: Self-pay | Admitting: Dermatology

## 2023-08-14 ENCOUNTER — Ambulatory Visit: Payer: Self-pay | Admitting: Dermatology

## 2023-10-14 ENCOUNTER — Ambulatory Visit: Admitting: Dermatology

## 2023-12-18 ENCOUNTER — Encounter: Payer: Self-pay | Admitting: Family Medicine

## 2023-12-18 ENCOUNTER — Ambulatory Visit: Admitting: Family Medicine

## 2023-12-18 VITALS — BP 100/76 | HR 68 | Resp 14 | Ht 66.0 in | Wt 144.5 lb

## 2023-12-18 DIAGNOSIS — M792 Neuralgia and neuritis, unspecified: Secondary | ICD-10-CM | POA: Diagnosis not present

## 2023-12-18 DIAGNOSIS — R519 Headache, unspecified: Secondary | ICD-10-CM | POA: Diagnosis not present

## 2023-12-18 NOTE — Progress Notes (Signed)
 Established patient visit   Patient: Kayla Alexander   DOB: 11-Feb-1971   53 y.o. Female  MRN: [id number] Visit Date: 12/18/2023  Today's healthcare provider: Nancyann Perry, MD   Chief Complaint  Patient presents with   Headache    Several months on top of head is tender, back of head is pressure ongoing 2 weeks. One day took sinus medication none since   Subjective    Discussed the use of AI scribe software for clinical note transcription with the patient, who gave verbal consent to proceed.  History of Present Illness   Kayla Alexander is a 53 year old female who presents with new onset headaches and scalp tenderness.  She has been experiencing new onset headaches and scalp tenderness for the past eight months. Initially, she noticed tenderness on the top of her head, described as 'annoying' and 'tender'. Consultations with a hairdresser and a dermatologist revealed no abnormalities on her scalp. Changing her shampoo did not alleviate the symptoms.  Approximately two weeks ago, she began experiencing pressure-like headaches on the right side of her occiput, intensifying in the afternoon around 2 or 3 PM. The headaches worsen with activities such as bending over, sneezing, or applying pressure, and are described as a 'pressure thing'. They are absent in the morning, do not wake her at night, but persist when she goes to bed, especially when exerting effort to get into her high bed. She recalls a possible incident about a month ago where she might have heard something 'pop' when moving the wrong way. However, the headaches on the top of her head started earlier, about eight months ago, and are described as tenderness rather than pain.  She has taken over-the-counter sinus medication on one or two occasions, which seemed to help, but she prefers to avoid medication unless necessary. No pain is present when turning her head side to side or looking up and down, and the headaches are not tender  to touch during the day.        Medications: Outpatient Medications Prior to Visit  Medication Sig   Adapalene  (DIFFERIN ) 0.3 % gel Apply a pea sized amount to the entire face QHS.   cetirizine  (ZYRTEC ) 10 MG tablet Take 1 tablet (10 mg total) by mouth daily.   fluticasone  (FLONASE ) 50 MCG/ACT nasal spray Place 2 sprays into both nostrils daily.   No facility-administered medications prior to visit.   Review of Systems  Constitutional:  Negative for appetite change, chills, fatigue and fever.  Respiratory:  Negative for chest tightness and shortness of breath.   Cardiovascular:  Negative for chest pain and palpitations.  Gastrointestinal:  Negative for abdominal pain, nausea and vomiting.  Neurological:  Negative for dizziness and weakness.       Objective    BP 100/76   Pulse 68   Resp 14   Ht 5' 6 (1.676 m)   Wt 144 lb 8 oz (65.5 kg)   SpO2 98%   BMI 23.32 kg/m   Physical Exam   General appearance: Well developed, well nourished female, cooperative and in no acute distress Head: Normocephalic, without obvious abnormality, atraumatic Respiratory: Respirations even and unlabored, normal respiratory rate Extremities: All extremities are intact. FROM of head and neck without pain.  Skin: Skin color, texture, turgor normal. No rashes seen Slight hypersensitivity across top of scalp.  Psych: Appropriate mood and affect. Neurologic: Mental status: Alert, oriented to person, place, and time, thought content appropriate.  No swelling, tenderness,  or gross abnormalities of right occipital region where headaches occur.    Assessment & Plan    1. Intractable episodic headache, unspecified headache type (Primary) New onset occipital headaches with no scalp tenderness in the area, unrelated to movements of head and neck, and no external findings. Concerning for possible intracranial lesion.  - MR Brain W Wo Contrast; Future  2. Neuralgia involving top of scalp Onset several  months prio to and unrelated to occipital headaches above. Patient was reassured that this is likely benign peripheral nerve sensation and doesn't require treatment if remains tolerable.        Nancyann Perry, MD  [facility] [phone] (phone) [phone] (fax)  [facility]

## 2023-12-23 ENCOUNTER — Encounter: Payer: Self-pay | Admitting: Family Medicine

## 2024-01-03 NOTE — Telephone Encounter (Signed)
 Copied from CRM [id number]. Topic: Referral - Status >> Jan 03, 2024  9:04 AM Donna BRAVO wrote: Reason for CRM: Kylie (Other) [phone] [facility] ex [phone]   Patient has  referall for MRI/CAT Scan/Imaging appt is scheduled for 01/06/24 Kylie spoke with insurance this morning 01/03/24 . Insurance does not has the referral and requires authorization for this scan.

## 2024-01-03 NOTE — Telephone Encounter (Signed)
 Copied from CRM [id number]. Topic: Referral - Status >> Jan 03, 2024  9:04 AM Donna BRAVO wrote: Reason for CRM: Kayla Alexander (Other) [phone] [facility] ex [phone]   Patient has  referall for MRI/CAT Scan/Imaging appt is scheduled for 01/06/24 Kayla Alexander spoke with insurance this morning 01/03/24 . Insurance does not has the referral and requires authorization for this scan.

## 2024-01-06 ENCOUNTER — Ambulatory Visit

## 2024-01-08 NOTE — Telephone Encounter (Signed)
 Copied from CRM [id number]. Topic: Referral - Status >> Jan 03, 2024  9:04 AM Donna BRAVO wrote: Reason for CRM: Kylie (Other) [phone] [facility] ex [phone]   Patient has  referall for MRI/CAT Scan/Imaging appt is scheduled for 01/06/24 Kylie spoke with insurance this morning 01/03/24 . Insurance does not has the referral and requires authorization for this scan. >> Jan 08, 2024 12:47 PM Emylou G wrote: Patient called.. concerned still waiting on MRI referral auth from Dr Gasper?  Pls call patient and advise

## 2024-01-24 ENCOUNTER — Ambulatory Visit
Admission: RE | Admit: 2024-01-24 | Discharge: 2024-01-24 | Disposition: A | Discharge status: Home / Self Care | Source: Ambulatory Visit | Attending: Family Medicine | Admitting: Family Medicine

## 2024-01-24 DIAGNOSIS — R519 Headache, unspecified: Secondary | ICD-10-CM | POA: Insufficient documentation

## 2024-01-24 MED ORDER — GADOBUTROL 1 MMOL/ML IV SOLN
6.0000 mL | Freq: Once | INTRAVENOUS | Status: AC | PRN
  Administered 2024-01-24: 6 mL via INTRAVENOUS

## 2024-02-06 ENCOUNTER — Ambulatory Visit: Payer: Self-pay | Admitting: Family Medicine

## 2024-04-07 ENCOUNTER — Encounter: Payer: Self-pay | Admitting: Dermatology

## 2024-04-07 ENCOUNTER — Ambulatory Visit: Admitting: Dermatology

## 2024-04-07 DIAGNOSIS — L720 Epidermal cyst: Secondary | ICD-10-CM | POA: Diagnosis not present

## 2024-04-07 DIAGNOSIS — D099 Carcinoma in situ, unspecified: Secondary | ICD-10-CM

## 2024-04-07 DIAGNOSIS — D485 Neoplasm of uncertain behavior of skin: Secondary | ICD-10-CM

## 2024-04-07 DIAGNOSIS — D0439 Carcinoma in situ of skin of other parts of face: Secondary | ICD-10-CM

## 2024-04-07 HISTORY — DX: Carcinoma in situ, unspecified: D09.9

## 2024-04-07 NOTE — Progress Notes (Signed)
" ° °  Follow-Up Visit   Subjective  Kayla Alexander is a 54 y.o. female who presents for the following: spot at nose that comes and goes for about 15 years. No tenderness, bleeding or itching. Patient with hx of BCC and DN.   The following portions of the chart were reviewed this encounter and updated as appropriate: medications, allergies, medical history  Review of Systems:  No other skin or systemic complaints except as noted in HPI or Assessment and Plan.  Objective  Well appearing patient in no apparent distress; mood and affect are within normal limits.   A focused examination was performed of the following areas: face  Relevant exam findings are noted in the Assessment and Plan.  superior nasal tip 6 mm cutaneous horn overlying pink papule   Assessment & Plan     NEOPLASM OF UNCERTAIN BEHAVIOR OF SKIN superior nasal tip - Epidermal / dermal shaving  Lesion diameter (cm):  0.6 Informed consent: discussed and consent obtained   Timeout: patient name, date of birth, surgical site, and procedure verified   Procedure prep:  Patient was prepped and draped in usual sterile fashion Prep type:  Isopropyl alcohol Anesthesia: the lesion was anesthetized in a standard fashion   Anesthetic:  1% lidocaine w/ epinephrine 1-100,000 buffered w/ 8.4% NaHCO3 Instrument used: DermaBlade   Hemostasis achieved with: pressure and aluminum chloride   Outcome: patient tolerated procedure well   Post-procedure details: wound care instructions given    Specimen 1 - Surgical pathology Differential Diagnosis: SK vs AK vs SCC  Check Margins: No  Return if symptoms worsen or fail to improve.  LILLETTE Lonell Drones, RMA, am acting as scribe for Boneta Sharps, MD .   Documentation: I have reviewed the above documentation for accuracy and completeness, and I agree with the above.  Boneta Sharps, MD    "

## 2024-04-07 NOTE — Patient Instructions (Addendum)
 Biopsy Wound Care Instructions  Leave the original bandage on for 24 hours if possible.  If the bandage becomes soaked or soiled before that time, it is OK to remove it and examine the wound.  A small amount of post-operative bleeding is normal.  If excessive bleeding occurs, remove the bandage, place gauze over the site and apply continuous pressure (no peeking) over the area for 30 minutes. If this does not work, please call our clinic as soon as possible or page your doctor if it is after hours.   Once a day, cleanse the wound with soap and water. It is fine to shower.   After washing, apply petroleum jelly (Vaseline) or an antibiotic ointment if your doctor prescribed one for you, followed by a bandage.    For best healing, the wound should be covered with a layer of ointment at all times. If you are not able to keep the area covered with a bandage to hold the ointment in place, this may mean re-applying the ointment several times a day.  Continue this wound care until the wound has healed and is no longer open.   Itching and mild discomfort is normal during the healing process. However, if you develop pain or severe itching, please call our office.   If you have any discomfort, you can take Tylenol (acetaminophen) or ibuprofen as directed on the bottle. (Please do not take these if you have an allergy to them or cannot take them for another reason).  Some redness, tenderness and white or yellow material in the wound is normal healing.  If the area becomes very sore and red, or develops a thick yellow-green material (pus), it may be infected; please notify us .    If you have stitches, return to clinic as directed to have the stitches removed. You will continue wound care for 2-3 days after the stitches are removed.   Wound healing continues for up to one year following surgery. It is not unusual to experience pain in the scar from time to time during the interval.  If the pain becomes severe or  the scar thickens, you should notify the office.    A slight amount of redness in a scar is expected for the first six months.  After six months, the redness will fade and the scar will soften and fade.  The color difference becomes less noticeable with time.  If there are any problems, return for a post-op surgery check at your earliest convenience.  To improve the appearance of the scar, you can use silicone scar gel, cream, or sheets (such as Mederma or Serica) every night for up to one year. These are available over the counter (without a prescription).  Please call our office at [phone] for any questions or concerns.  Due to recent changes in healthcare laws, you may see results of your pathology and/or laboratory studies on MyChart before the doctors have had a chance to review them. We understand that in some cases there may be results that are confusing or concerning to you. Please understand that not all results are received at the same time and often the doctors may need to interpret multiple results in order to provide you with the best plan of care or course of treatment. Therefore, we ask that you please give us  2 business days to thoroughly review all your results before contacting the office for clarification. Should we see a critical lab result, you will be contacted sooner.   If  You Need Anything After Your Visit  If you have any questions or concerns for your doctor, please call our main line at [phone] and press option 4 to reach your doctor's medical assistant. If no one answers, please leave a voicemail as directed and we will return your call as soon as possible. Messages left after 4 pm will be answered the following business day.   You may also send us  a message via MyChart. We typically respond to MyChart messages within 1-2 business days.  For prescription refills, please ask your pharmacy to contact our office. Our fax number is [phone].  If you have an  urgent issue when the clinic is closed that cannot wait until the next business day, you can page your doctor at the number below.    Please note that while we do our best to be available for urgent issues outside of office hours, we are not available 24/7.   If you have an urgent issue and are unable to reach us , you may choose to seek medical care at your doctor's office, retail clinic, urgent care center, or emergency room.  If you have a medical emergency, please immediately call 911 or go to the emergency department.  Pager Numbers  - Dr. Hester: [phone]  - Dr. Jackquline: [phone]  - Dr. Claudene: [phone]   - Dr. Raymund: [phone]  In the event of inclement weather, please call our main line at [phone] for an update on the status of any delays or closures.  Dermatology Medication Tips: Please keep the boxes that topical medications come in in order to help keep track of the instructions about where and how to use these. Pharmacies typically print the medication instructions only on the boxes and not directly on the medication tubes.   If your medication is too expensive, please contact our office at [phone] option 4 or send us  a message through MyChart.   We are unable to tell what your co-pay for medications will be in advance as this is different depending on your insurance coverage. However, we may be able to find a substitute medication at lower cost or fill out paperwork to get insurance to cover a needed medication.   If a prior authorization is required to get your medication covered by your insurance company, please allow us  1-2 business days to complete this process.  Drug prices often vary depending on where the prescription is filled and some pharmacies may offer cheaper prices.  The website www.goodrx.com contains coupons for medications through different pharmacies. The prices here do not account for what the cost may be with help from insurance  (it may be cheaper with your insurance), but the website can give you the price if you did not use any insurance.  - You can print the associated coupon and take it with your prescription to the pharmacy.  - You may also stop by our office during regular business hours and pick up a GoodRx coupon card.  - If you need your prescription sent electronically to a different pharmacy, notify our office through [facility] or by phone at [phone] option 4.     Si Usted Necesita Algo Despus de Su Visita  Tambin puede enviarnos un mensaje a travs de Clinical Cytogeneticist. Por lo general respondemos a los mensajes de MyChart en el transcurso de 1 a 2 das hbiles.  Para renovar recetas, por favor pida a su farmacia que se ponga en contacto con nuestra oficina. Nuestro nmero de fax  es el [phone].  Si tiene un asunto urgente cuando la clnica est cerrada y que no puede esperar hasta el siguiente da hbil, puede llamar/localizar a su doctor(a) al nmero que aparece a continuacin.   Por favor, tenga en cuenta que aunque hacemos todo lo posible para estar disponibles para asuntos urgentes fuera del horario de [place], no estamos disponibles las 24 horas del da, los 7 [address]  [phone] de la [place].   Si tiene un problema urgente y no puede comunicarse con nosotros, puede optar por buscar atencin mdica  en el consultorio de su doctor(a), en una clnica privada, en un centro de atencin urgente o en una sala de emergencias.  Si tiene engineer, drilling, por favor llame inmediatamente al 911 o vaya a la sala de emergencias.  Nmeros de bper  - Dr. Hester: [phone]  - Dra. Jackquline: [phone]  - Dr. Claudene: [phone]  - Dra. Kitts: [phone]  En caso de inclemencias del [place], por favor llame a nuestra lnea principal al [phone] para una actualizacin sobre el estado de cualquier retraso o cierre.  Consejos para la medicacin en dermatologa: Por favor, guarde las cajas en las  que vienen los medicamentos de uso tpico para ayudarle a seguir las instrucciones sobre dnde y cmo usarlos. Las farmacias generalmente imprimen las instrucciones del medicamento slo en las cajas y no directamente en los tubos del [place].   Si su medicamento es muy caro, por favor, pngase en contacto con landry rieger llamando al [phone] y presione la opcin 4 o envenos un mensaje a travs de Clinical Cytogeneticist.   No podemos decirle cul ser su copago por los medicamentos por adelantado ya que esto es diferente dependiendo de la cobertura de su seguro. Sin embargo, es posible que podamos encontrar un medicamento sustituto a audiological scientist un formulario para que el seguro cubra el medicamento que se considera necesario.   Si se requiere una autorizacin previa para que su compaa de seguros cubra su medicamento, por favor permtanos de 1 a 2 das hbiles para completar este proceso.  Los precios de los medicamentos varan con frecuencia dependiendo del environmental consultant de dnde se surte la receta y alguna farmacias pueden ofrecer precios ms baratos.  El sitio web www.goodrx.com tiene cupones para medicamentos de health and safety inspector. Los precios aqu no tienen en cuenta lo que podra costar con la ayuda del seguro (puede ser ms barato con su seguro), pero el sitio web puede darle el precio si no utiliz tourist information centre manager.  - Puede imprimir el cupn correspondiente y llevarlo con su receta a la farmacia.  - Tambin puede pasar por nuestra oficina durante el horario de atencin regular y education officer, museum una tarjeta de cupones de GoodRx.  - Si necesita que su receta se enve electrnicamente a una farmacia diferente, informe a nuestra oficina a travs de MyChart de  o por telfono llamando al [phone] y presione la opcin 4.

## 2024-04-13 ENCOUNTER — Ambulatory Visit: Payer: Self-pay | Admitting: Dermatology

## 2024-04-13 DIAGNOSIS — C4492 Squamous cell carcinoma of skin, unspecified: Secondary | ICD-10-CM

## 2024-04-13 LAB — SURGICAL PATHOLOGY

## 2024-04-14 ENCOUNTER — Encounter: Payer: No Typology Code available for payment source | Admitting: Family Medicine

## 2024-04-14 NOTE — Telephone Encounter (Signed)
 LMOVM to C/B for pathology results and treatment plan.

## 2024-04-15 NOTE — Addendum Note (Signed)
 Addended by: TERESA PALMA R on: 04/15/2024 08:14 AM   Modules accepted: Orders

## 2024-04-15 NOTE — Telephone Encounter (Signed)
 Patient advised of BX results. She would like for the referral to be sent to Dr. Bluford at [facility] first. If the wait time is too long, she is going to call me back. Her son is getting married in May so she is trying to have this completed and healed. aw

## 2024-04-16 ENCOUNTER — Encounter: Payer: Self-pay | Admitting: Family Medicine

## 2024-04-17 ENCOUNTER — Encounter: Payer: Self-pay | Admitting: Family Medicine

## 2024-04-17 ENCOUNTER — Ambulatory Visit (INDEPENDENT_AMBULATORY_CARE_PROVIDER_SITE_OTHER): Admitting: Family Medicine

## 2024-04-17 VITALS — BP 99/74 | HR 71 | Resp 16 | Ht 66.0 in | Wt 141.0 lb

## 2024-04-17 DIAGNOSIS — Z1231 Encounter for screening mammogram for malignant neoplasm of breast: Secondary | ICD-10-CM | POA: Diagnosis not present

## 2024-04-17 DIAGNOSIS — E782 Mixed hyperlipidemia: Secondary | ICD-10-CM | POA: Diagnosis not present

## 2024-04-17 DIAGNOSIS — E538 Deficiency of other specified B group vitamins: Secondary | ICD-10-CM | POA: Diagnosis not present

## 2024-04-17 DIAGNOSIS — Z0000 Encounter for general adult medical examination without abnormal findings: Secondary | ICD-10-CM | POA: Diagnosis not present

## 2024-04-17 DIAGNOSIS — Z823 Family history of stroke: Secondary | ICD-10-CM | POA: Diagnosis not present

## 2024-04-17 DIAGNOSIS — Z23 Encounter for immunization: Secondary | ICD-10-CM

## 2024-04-17 DIAGNOSIS — Z1211 Encounter for screening for malignant neoplasm of colon: Secondary | ICD-10-CM

## 2024-04-17 DIAGNOSIS — Z8249 Family history of ischemic heart disease and other diseases of the circulatory system: Secondary | ICD-10-CM
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
## 2024-04-17 NOTE — Patient Instructions (Addendum)
 Please review the attached list of medications and notify my office if there are any errors.   Please call the [facility] [phone]) to schedule a routine screening mammogram.  I recommend that you get the Prevnar 20 vaccine to protect yourself from certain dangerous strains of pneumonia. You can get Prevnar 20 at your pharmacy, or call our office at [phone] at your earliest convenience to schedule this vaccine.   I recommend that you get the Hepatitis B vaccine. You can get this vaccine at most pharmacies, or schedule an appointment to get it at [facility].   I strongly recommend two doses of Shingrix (the shingles vaccine) separated by 2 to 6 months for adults age 54 years and older. I recommend checking with your insurance plan regarding coverage for this vaccine.

## 2024-04-21 ENCOUNTER — Ambulatory Visit: Payer: Self-pay | Admitting: Family Medicine

## 2024-04-21 DIAGNOSIS — E7841 Elevated Lipoprotein(a): Secondary | ICD-10-CM | POA: Insufficient documentation

## 2024-04-21 DIAGNOSIS — E782 Mixed hyperlipidemia: Secondary | ICD-10-CM

## 2024-04-21 DIAGNOSIS — D099 Carcinoma in situ, unspecified: Secondary | ICD-10-CM

## 2024-04-21 LAB — LIPID PANEL
Chol/HDL Ratio: 3.7 ratio (ref 0.0–4.4)
Cholesterol, Total: 230 mg/dL — ABNORMAL HIGH (ref 100–199)
HDL: 62 mg/dL
LDL Chol Calc (NIH): 154 mg/dL — ABNORMAL HIGH (ref 0–99)
Triglycerides: 79 mg/dL (ref 0–149)
VLDL Cholesterol Cal: 14 mg/dL (ref 5–40)

## 2024-04-21 LAB — CBC
Hematocrit: 44.9 % (ref 34.0–46.6)
Hemoglobin: 14.8 g/dL (ref 11.1–15.9)
MCH: 31.9 pg (ref 26.6–33.0)
MCHC: 33 g/dL (ref 31.5–35.7)
MCV: 97 fL (ref 79–97)
Platelets: 232 10*3/uL (ref 150–450)
RBC: 4.64 x10E6/uL (ref 3.77–5.28)
RDW: 12.1 % (ref 11.7–15.4)
WBC: 5.5 10*3/uL (ref 3.4–10.8)

## 2024-04-21 LAB — LIPOPROTEIN A (LPA): Lipoprotein (a): 170.7 nmol/L — AB

## 2024-04-21 LAB — COMPREHENSIVE METABOLIC PANEL WITH GFR
ALT: 18 [IU]/L (ref 0–32)
AST: 17 [IU]/L (ref 0–40)
Albumin: 4.5 g/dL (ref 3.8–4.9)
Alkaline Phosphatase: 73 [IU]/L (ref 49–135)
BUN/Creatinine Ratio: 21 (ref 9–23)
BUN: 20 mg/dL (ref 6–24)
Bilirubin Total: 0.5 mg/dL (ref 0.0–1.2)
CO2: 23 mmol/L (ref 20–29)
Calcium: 9.4 mg/dL (ref 8.7–10.2)
Chloride: 107 mmol/L — ABNORMAL HIGH (ref 96–106)
Creatinine, Ser: 0.94 mg/dL (ref 0.57–1.00)
Globulin, Total: 2.1 g/dL (ref 1.5–4.5)
Glucose: 89 mg/dL (ref 70–99)
Potassium: 4.3 mmol/L (ref 3.5–5.2)
Sodium: 140 mmol/L (ref 134–144)
Total Protein: 6.6 g/dL (ref 6.0–8.5)
eGFR: 73 mL/min/{1.73_m2}

## 2024-04-21 LAB — TSH: TSH: 0.706 u[IU]/mL (ref 0.450–4.500)

## 2024-04-21 LAB — VITAMIN B12: Vitamin B-12: 659 pg/mL (ref 232–1245)

## 2024-04-21 LAB — APOLIPOPROTEIN B: Apolipoprotein B: 111 mg/dL — ABNORMAL HIGH

## 2024-04-21 MED ORDER — ROSUVASTATIN CALCIUM 10 MG PO TABS: 10.0000 mg | ORAL_TABLET | Freq: Every day | ORAL | 1 refills | Status: AC

## 2024-04-21 NOTE — Progress Notes (Signed)
 Last read by Olam Oka at 9:10AM on 04/21/2024.

## 2024-05-14 ENCOUNTER — Encounter: Admitting: Dermatology

## 2024-07-01 ENCOUNTER — Ambulatory Visit: Admitting: Family Medicine

## 2025-04-19 ENCOUNTER — Encounter: Admitting: Family Medicine
